# Patient Record
Sex: Male | Born: 1995 | Race: White | Hispanic: No | Marital: Single | Smoking: Current every day smoker
Health system: Southern US, Community
[De-identification: ages and names within clinical notes are randomized; demographics above are authoritative.]

## PROBLEM LIST (undated history)

## (undated) DIAGNOSIS — J45909 Unspecified asthma, uncomplicated: Secondary | ICD-10-CM

## (undated) DIAGNOSIS — G8929 Other chronic pain: Secondary | ICD-10-CM

---
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
## 2016-06-17 ENCOUNTER — Encounter: Payer: Self-pay | Admitting: Emergency Medicine

## 2016-06-17 ENCOUNTER — Emergency Department
Admission: EM | Admit: 2016-06-17 | Discharge: 2016-06-17 | Disposition: A | Discharge status: Home / Self Care | Payer: Self-pay | Attending: Emergency Medicine | Admitting: Emergency Medicine

## 2016-06-17 DIAGNOSIS — J45909 Unspecified asthma, uncomplicated: Secondary | ICD-10-CM | POA: Insufficient documentation

## 2016-06-17 DIAGNOSIS — M436 Torticollis: Secondary | ICD-10-CM | POA: Insufficient documentation

## 2016-06-17 MED ORDER — KETOROLAC TROMETHAMINE 10 MG PO TABS
10.0000 mg | ORAL_TABLET | Freq: Once | ORAL | Status: AC
  Administered 2016-06-17: 10 mg via ORAL
  Filled 2016-06-17: qty 1

## 2016-06-17 MED ORDER — CYCLOBENZAPRINE HCL 10 MG PO TABS: 10.0000 mg | ORAL_TABLET | Freq: Three times a day (TID) | ORAL | 0 refills | Status: DC | PRN

## 2016-06-17 MED ORDER — CYCLOBENZAPRINE HCL 10 MG PO TABS
10.0000 mg | ORAL_TABLET | Freq: Once | ORAL | Status: AC
Start: 2016-06-17 — End: 2016-06-17
  Administered 2016-06-17: 10 mg via ORAL
  Filled 2016-06-17: qty 1

## 2016-06-17 NOTE — ED Triage Notes (Signed)
Pt ambulatory to triage with steady gait. Pt c/o "crick in my neck since yesterday, its so bad this morning." Pt denies other complaints at this time. Pt alert and oriented x 4.

## 2016-06-17 NOTE — ED Provider Notes (Signed)
[facility] Emergency Department Provider Note   First MD Initiated Contact with Patient 06/17/16 [phone]     (approximate)  I have reviewed the triage vital signs and the nursing notes.   HISTORY  Chief Complaint Neck Pain   HPI Jose Everett is a 21 y.o. male presents with acute onset of right side neck pain on awakening yesterday at 1:00AM. Current pain is sharp 8 out of 10 that is worse with movement. Patient denies any arm weakness numbness gait instability or visual changes.   Past Medical History:  Diagnosis Date  . Asthma     Patient Active Problem List   Diagnosis Date Noted  . Pneumothorax on left 08/22/2015    History reviewed. No pertinent surgical history.  Prior to Admission medications   Medication Sig Start Date End Date Taking? Authorizing Provider  cyclobenzaprine (FLEXERIL) 10 MG tablet Take 1 tablet (10 mg total) by mouth 3 (three) times daily as needed. 06/17/16   [name], MD  ibuprofen (ADVIL,MOTRIN) 800 MG tablet Take 1 tablet (800 mg total) by mouth every 8 (eight) hours as needed for moderate pain. 09/09/15   [name], MD  oxyCODONE-acetaminophen (ROXICET) 5-325 MG tablet Take 1 tablet by mouth every 4 (four) hours as needed for severe pain. 09/09/15   [name], MD  traMADol (ULTRAM) 50 MG tablet Take 1 tablet (50 mg total) by mouth every 6 (six) hours as needed for severe pain (use tylenol preferentially). 08/23/15   [name], MD    Allergies Tramadol  No family history on file.  Social History Social History  Substance Use Topics  . Smoking status: Never Smoker  . Smokeless tobacco: Never Used  . Alcohol use Yes     Comment: occ    Review of Systems Constitutional: No fever/chills Eyes: No visual changes. ENT: No sore throat.Positive for right-sided neck pain Cardiovascular: Denies chest pain. Respiratory: Denies shortness of breath. Gastrointestinal: No abdominal pain.  No  nausea, no vomiting.  No diarrhea.  No constipation. Genitourinary: Negative for dysuria. Musculoskeletal: Negative for neck pain.  Negative for back pain. Integumentary: Negative for rash. Neurological: Negative for headaches, focal weakness or numbness.   ____________________________________________   PHYSICAL EXAM:  VITAL SIGNS: ED Triage Vitals  Enc Vitals Group     BP 06/17/16 0551 130/89     Pulse Rate 06/17/16 0551 78     Resp 06/17/16 0551 18     Temp 06/17/16 0551 98.2 F (36.8 C)     Temp Source 06/17/16 0551 Oral     SpO2 06/17/16 0551 98 %     Weight 06/17/16 0551 81.6 kg (180 lb)     Height 06/17/16 0551 1.829 m (6')     Head Circumference --      Peak Flow --      Pain Score 06/17/16 0550 10     Pain Loc --      Pain Edu? --      Excl. in GC? --     Constitutional: Alert and oriented. Well appearing and in no acute distress. Eyes: Conjunctivae are normal.  Head: Atraumatic. Mouth/Throat: Mucous membranes are moist.  Oropharynx non-erythematous. Neck: No stridor.  Pain in her right side neck palpation of the trapezius muscle  Cardiovascular: Normal rate, regular rhythm. Good peripheral circulation. Grossly normal heart sounds. Respiratory: Normal respiratory effort.  No retractions. Lungs CTAB. Gastrointestinal: Soft and nontender. No distention.  Musculoskeletal: No lower extremity tenderness nor  edema. No gross deformities of extremities. Neurologic:  Normal speech and language. No gross focal neurologic deficits are appreciated.  Skin:  Skin is warm, dry and intact. No rash noted. Psychiatric: Mood and affect are normal. Speech and behavior are normal.   Procedures   ____________________________________________   INITIAL IMPRESSION / ASSESSMENT AND PLAN / ED COURSE  Pertinent labs & imaging results that were available during my care of the patient were reviewed by me and considered in my medical decision making (see chart for details).  Patient  given Flexeril and Toradol emergency department will be prescribed Flexeril for home.      ____________________________________________  FINAL CLINICAL IMPRESSION(S) / ED DIAGNOSES  Final diagnoses:  Torticollis, acute     MEDICATIONS GIVEN DURING THIS VISIT:  Medications  ketorolac (TORADOL) tablet 10 mg (10 mg Oral Given 06/17/16 0633)  cyclobenzaprine (FLEXERIL) tablet 10 mg (10 mg Oral Given 06/17/16 0633)     NEW OUTPATIENT MEDICATIONS STARTED DURING THIS VISIT:  New Prescriptions   CYCLOBENZAPRINE (FLEXERIL) 10 MG TABLET    Take 1 tablet (10 mg total) by mouth 3 (three) times daily as needed.    Modified Medications   No medications on file    Discontinued Medications   No medications on file     Note:  This document was prepared using Dragon voice recognition software and may include unintentional dictation errors.    Darci CurrentBrown, Kearny N, MD 06/17/16 [phone]

## 2017-02-28 ENCOUNTER — Encounter: Payer: Self-pay | Admitting: *Deleted

## 2017-02-28 ENCOUNTER — Emergency Department
Admission: EM | Admit: 2017-02-28 | Discharge: 2017-02-28 | Disposition: A | Discharge status: Home / Self Care | Payer: Self-pay | Attending: Emergency Medicine | Admitting: Emergency Medicine

## 2017-02-28 ENCOUNTER — Other Ambulatory Visit: Payer: Self-pay

## 2017-02-28 DIAGNOSIS — F17200 Nicotine dependence, unspecified, uncomplicated: Secondary | ICD-10-CM | POA: Insufficient documentation

## 2017-02-28 DIAGNOSIS — J45909 Unspecified asthma, uncomplicated: Secondary | ICD-10-CM | POA: Insufficient documentation

## 2017-02-28 DIAGNOSIS — K0889 Other specified disorders of teeth and supporting structures: Secondary | ICD-10-CM | POA: Insufficient documentation

## 2017-02-28 MED ORDER — IBUPROFEN 800 MG PO TABS: 800.0000 mg | ORAL_TABLET | Freq: Three times a day (TID) | ORAL | 0 refills | Status: DC | PRN

## 2017-02-28 MED ORDER — AMOXICILLIN 500 MG PO CAPS: 500.0000 mg | ORAL_CAPSULE | Freq: Three times a day (TID) | ORAL | 0 refills | Status: DC

## 2017-02-28 NOTE — Discharge Instructions (Signed)
Follow-up with your regular doctor.  Use 1 of the following dental clinics.  Please call for an appointment.  Use medication as prescribed.  OPTIONS FOR DENTAL FOLLOW UP CARE  [place] Department of Health and Human Services - Local Safety Net Dental Clinics [email or web address]://www.ncdhhs.gov/dph/oralhealth/services/safetynetclinics.htm   [facility] [phone])  [facility] [place] [phone])  [facility] [place] [facility] [phone]  [facility] [phone])  [facility] [phone]) This clinic caters to the indigent population and is on a lottery system. Location: [employer] [address], [address], [place] [facility] Hours: Wednesdays from 6pm - 9pm, patients seen by a lottery system. For dates, call or go to [email or web address] Services: Cleanings, fillings and simple extractions. Payment Options: DENTAL WORK IS FREE OF CHARGE. Bring proof of income or support. Best way to get seen: Arrive at 5:15 pm - this is a lottery, NOT first come/first serve, so arriving earlier will not increase your chances of being seen.     [facility] [phone] Select option 1 for emergencies   Location: [facility], [facility], [address], [place] [facility] Hours: No walk-ins accepted - call the day before to schedule an appointment. Check in times are 9:30 am and 1:30 pm. Services: Simple extractions, temporary fillings, pulpectomy/pulp debridement, uncomplicated abscess drainage. Payment Options: PAYMENT IS DUE AT THE TIME OF SERVICE.  Fee is usually $100-200, additional surgical procedures (e.g. abscess drainage) may be extra. Cash, checks, Visa/MasterCard accepted.  Can file Medicaid if patient is covered for dental - patient should call case worker to check. No discount for [facility] patients. Best way to get seen: MUST call the day before and  get onto the schedule. Can usually be seen the next 1-2 days. No walk-ins accepted.     [facility] [phone]   Location: [facility], [address], [place] [facility] Hours: M, W, Th, F 8am or 1:30pm, Tues 9a or 1:30 - first come/first served. Services: Simple extractions, temporary fillings, uncomplicated abscess drainage.  You do not need to be an [place] resident. Payment Options: PAYMENT IS DUE AT THE TIME OF SERVICE. Dental insurance, otherwise sliding scale - bring proof of income or support. Depending on income and treatment needed, cost is usually $50-200. Best way to get seen: Arrive early as it is first come/first served.     [facility] [phone]   Location: [address] Clinic Hours: Mon-Thu 8a-5p Services: Most basic dental services including extractions and fillings. Payment Options: PAYMENT IS DUE AT THE TIME OF SERVICE. Sliding scale, up to 50% off - bring proof if income or support. Medicaid with dental option accepted. Best way to get seen: Call to schedule an appointment, can usually be seen within 2 weeks OR they will try to see walk-ins - show up at 8a or 2p (you may have to wait).     [facility] [phone] [place] RESIDENTS ONLY   Location: [facility], [address], [place], [place] [address] Clinic Hours: By appointment only. Monday - Thursday 8am-5pm, Friday 8am-12pm Services: Cleanings, fillings, extractions. Payment Options: PAYMENT IS DUE AT THE TIME OF SERVICE. Cash, Visa or MasterCard. Sliding scale - $30 minimum per service. Best way to get seen: Come in to office, complete packet and make an appointment - need proof of income or support monies for each household member and proof of [facility] [place] [facility] [place]  residence. Usually takes about a month to get in.     [facility] [phone]   Location: [address], [facility] Hours: Walk-in Urgent Care Dental Services are offered Monday-Friday mornings only. The numbers of emergencies accepted daily is limited to the number of providers available. Maximum 15 - Mondays, Wednesdays & Thursdays Maximum 10 - Tuesdays & Fridays Services: You do not need to be a [facility] resident to be seen for a dental emergency. Emergencies are defined as pain, swelling, abnormal bleeding, or dental trauma. Walkins will receive x-rays if needed. NOTE: Dental cleaning is not an emergency. Payment Options: PAYMENT IS DUE AT THE TIME OF SERVICE. Minimum co-pay is $40.00 for uninsured patients. Minimum co-pay is $3.00 for Medicaid with dental coverage. Dental Insurance is accepted and must be presented at time of visit. Medicare does not cover dental. Forms of payment: Cash, credit card, checks. Best way to get seen: If not previously registered with the clinic, walk-in dental registration begins at 7:15 am and is on a first come/first serve basis. If previously registered with the clinic, call to make an appointment.     [facility] [phone] [place] RESIDENTS ONLY   Location: [address] [place], [place] Clinic Hours: Mon-Thu 10a-2p Services: Extractions only! Payment Options: FREE (donations accepted) - bring proof of income or support Best way to get seen: Call and schedule an appointment OR come at 8am on the 1st Monday of every month (except for holidays) when it is first come/first served.     [facility] [phone]   Location: [address] [place], [place] Clinic Hours: Friday mornings Services, Payment Options, Best way to get seen: Call for info

## 2017-02-28 NOTE — ED Provider Notes (Signed)
[facility] Emergency Department Provider Note  ____________________________________________   First MD Initiated Contact with Patient 02/28/17 1729     (approximate)  I have reviewed the triage vital signs and the nursing notes.   HISTORY  Chief Complaint Dental Pain    HPI [facility] Towanda MalkinJ Willis is a 22 y.o. male complains of tooth pain.  States he has really bad cavities.  Has wisdom teeth coming in.  And he does not have any health insurance or dental insurance.  He denies fever or chills.  Denies chest pain or shortness of breath  Past Medical History:  Diagnosis Date  . Asthma     Patient Active Problem List   Diagnosis Date Noted  . Pneumothorax on left 08/22/2015    History reviewed. No pertinent surgical history.  Prior to Admission medications   Medication Sig Start Date End Date Taking? Authorizing Provider  amoxicillin (AMOXIL) 500 MG capsule Take 1 capsule (500 mg total) by mouth 3 (three) times daily. 02/28/17   Tyyonna Soucy, Roselyn BeringSusan W, PA-C  ibuprofen (ADVIL,MOTRIN) 800 MG tablet Take 1 tablet (800 mg total) by mouth every 8 (eight) hours as needed. 02/28/17   Faythe GheeFisher, Tykira Wachs W, PA-C    Allergies Tramadol  No family history on file.  Social History Social History   Tobacco Use  . Smoking status: Current Every Day Smoker  . Smokeless tobacco: Never Used  Substance Use Topics  . Alcohol use: Yes    Comment: occ  . Drug use: No    Review of Systems  Constitutional: No fever/chills Eyes: No visual changes. ENT: No sore throat.  Positive for dental pain Respiratory: Denies cough Genitourinary: Negative for dysuria. Musculoskeletal: Negative for back pain. Skin: Negative for rash.    ____________________________________________   PHYSICAL EXAM:  VITAL SIGNS: ED Triage Vitals  Enc Vitals Group     BP 02/28/17 1727 125/76     Pulse Rate 02/28/17 1727 74     Resp 02/28/17 1727 18     Temp 02/28/17 1727 98.1 F (36.7  C)     Temp src --      SpO2 02/28/17 1727 99 %     Weight 02/28/17 1742 195 lb (88.5 kg)     Height 02/28/17 1742 5\' 11"  (1.803 m)     Head Circumference --      Peak Flow --      Pain Score 02/28/17 1742 6     Pain Loc --      Pain Edu? --      Excl. in GC? --     Constitutional: Alert and oriented. Well appearing and in no acute distress. Eyes: Conjunctivae are normal.  Head: Atraumatic. Nose: No congestion/rhinnorhea. Mouth/Throat: Mucous membranes are moist.  Positive for diffuse dental caries and gums are red irritation on the right lower molar.  There is no swelling noted Cardiovascular: Normal rate, regular rhythm. Respiratory: Normal respiratory effort.  No retractions GU: deferred Musculoskeletal: FROM all extremities, warm and well perfused Neurologic:  Normal speech and language.  Skin:  Skin is warm, dry and intact. No rash noted. Psychiatric: Mood and affect are normal. Speech and behavior are normal.  ____________________________________________   LABS (all labs ordered are listed, but only abnormal results are displayed)  Labs Reviewed - No data to display ____________________________________________   ____________________________________________  RADIOLOGY    ____________________________________________   PROCEDURES  Procedure(s) performed: No  Procedures    ____________________________________________   INITIAL IMPRESSION / ASSESSMENT AND PLAN /  ED COURSE  Pertinent labs & imaging results that were available during my care of the patient were reviewed by me and considered in my medical decision making (see chart for details).  Patient is a 22 year old male complaining of dental pain.  He is not have health insurance or dental insurance.  He has been using over-the-counter medications.  On physical exam the patient has several caries noted.  The gums are inflamed.  There is a lot of plaque noted on the teeth.  Patient was instructed to  follow-up with a dentist.  Was given a prescription for amoxicillin and ibuprofen.  Instructed him to call 1 of these dentist as we do not provide dental care in the emergency department.  He is to wash his mouth with warm salt water.  He is to brush his teeth daily.  If he is worsening he can return to the emergency department.  Patient states he understands will comply with instructions.  He was discharged in stable condition     As part of my medical decision making, I reviewed the following data within the electronic MEDICAL RECORD NUMBER Nursing notes reviewed and incorporated, Old chart reviewed, Notes from prior ED visits and [place] Controlled Substance Database  ____________________________________________   FINAL CLINICAL IMPRESSION(S) / ED DIAGNOSES  Final diagnoses:  Tooth ache      NEW MEDICATIONS STARTED DURING THIS VISIT:  New Prescriptions   AMOXICILLIN (AMOXIL) 500 MG CAPSULE    Take 1 capsule (500 mg total) by mouth 3 (three) times daily.   IBUPROFEN (ADVIL,MOTRIN) 800 MG TABLET    Take 1 tablet (800 mg total) by mouth every 8 (eight) hours as needed.     Note:  This document was prepared using Dragon voice recognition software and may include unintentional dictation errors.    Faythe Ghee, PA-C 02/28/17 Elwyn Reach, MD 02/28/17 [phone]

## 2017-02-28 NOTE — ED Triage Notes (Signed)
Patient c/o right-side dental pain on top and bottom jaw that has been present for two months, but is now constant.

## 2017-08-02 ENCOUNTER — Other Ambulatory Visit: Payer: Self-pay

## 2017-08-02 ENCOUNTER — Emergency Department
Admission: EM | Admit: 2017-08-02 | Discharge: 2017-08-02 | Disposition: A | Discharge status: Home / Self Care | Payer: Self-pay | Attending: Emergency Medicine | Admitting: Emergency Medicine

## 2017-08-02 ENCOUNTER — Encounter: Payer: Self-pay | Admitting: Emergency Medicine

## 2017-08-02 DIAGNOSIS — J45909 Unspecified asthma, uncomplicated: Secondary | ICD-10-CM | POA: Insufficient documentation

## 2017-08-02 DIAGNOSIS — F17200 Nicotine dependence, unspecified, uncomplicated: Secondary | ICD-10-CM | POA: Insufficient documentation

## 2017-08-02 DIAGNOSIS — K0889 Other specified disorders of teeth and supporting structures: Secondary | ICD-10-CM | POA: Insufficient documentation

## 2017-08-02 MED ORDER — IBUPROFEN 800 MG PO TABS
800.0000 mg | ORAL_TABLET | Freq: Once | ORAL | Status: AC
  Administered 2017-08-02: 800 mg via ORAL
  Filled 2017-08-02: qty 1

## 2017-08-02 MED ORDER — AMOXICILLIN 500 MG PO TABS: 500.0000 mg | ORAL_TABLET | Freq: Three times a day (TID) | ORAL | 0 refills | Status: AC

## 2017-08-02 NOTE — Discharge Instructions (Signed)
OPTIONS FOR DENTAL FOLLOW UP CARE ° °[place] Department of Health and Human Services - Local Safety Net Dental Clinics °http://www.ncdhhs.gov/dph/oralhealth/services/safetynetclinics.htm °  °[facility] (336-562-3123) ° °[facility] (919-933-9087) ° °[facility] (919-663-1744 ext [phone]) ° °[facility] ([phone]) ° °[facility] ([phone]) °This clinic caters to the indigent population and is on a lottery system. °Location: °[facility], [address], [place] °Clinic Hours: °Wednesdays from 6pm - 9pm, patients seen by a lottery system. °For dates, call or go to www.med.[facility].edu/[facility]/patients/Dental-[facility] °Services: °Cleanings, fillings and simple extractions. °Payment Options: °DENTAL WORK IS FREE OF CHARGE. Bring proof of income or support. °Best way to get seen: °Arrive at 5:15 pm - this is a lottery, NOT first come/first serve, so arriving earlier will not increase your chances of being seen. °  °  °[facility] °919-537-3737 °Select option 1 for emergencies °  °Location: °[facility], [address], [place] °Clinic Hours: °No walk-ins accepted - call the day before to schedule an appointment. °Check in times are 9:30 am and 1:30 pm. °Services: °Simple extractions, temporary fillings, pulpectomy/pulp debridement, uncomplicated abscess drainage. °Payment Options: °PAYMENT IS DUE AT THE TIME OF SERVICE.  Fee is usually $100-200, additional surgical procedures (e.g. abscess drainage) may be extra. °Cash, checks, Visa/MasterCard accepted.  Can file Medicaid if patient is covered for dental - patient should call case worker to check. °No discount for [facility] patients. °Best way to get seen: °MUST call the day before and get onto the schedule. Can usually be seen the next 1-2 days. No walk-ins accepted. °  °  °[facility] °[phone] °   °Location: °[facility], [address], [place] °Clinic Hours: °M, W, Th, F 8am or 1:30pm, Tues 9a or 1:30 - first come/first served. °Services: °Simple extractions, temporary fillings, uncomplicated abscess drainage.  You do not need to be an [place] resident. °Payment Options: °PAYMENT IS DUE AT THE TIME OF SERVICE. °Dental insurance, otherwise sliding scale - bring proof of income or support. °Depending on income and treatment needed, cost is usually $50-200. °Best way to get seen: °Arrive early as it is first come/first served. °  °  °[facility] °[phone] °  °Location: °[address] °Clinic Hours: °Mon-Thu 8a-5p °Services: °Most basic dental services including extractions and fillings. °Payment Options: °PAYMENT IS DUE AT THE TIME OF SERVICE. °Sliding scale, up to 50% off - bring proof if income or support. °Medicaid with dental option accepted. °Best way to get seen: °Call to schedule an appointment, can usually be seen within 2 weeks OR they will try to see walk-ins - show up at 8a or 2p (you may have to wait). °  °  °[facility] °919-245-2435 °[place] RESIDENTS ONLY °  °Location: °[facility], [address], [place], [place] [address] °Clinic Hours: By appointment only. °Monday - Thursday 8am-5pm, Friday 8am-12pm °Services: Cleanings, fillings, extractions. °Payment Options: °PAYMENT IS DUE AT THE TIME OF SERVICE. °Cash, Visa or MasterCard. Sliding scale - $30 minimum per service. °Best way to get seen: °Come in to office, complete packet and make an appointment - need proof of income °or support monies for each household member and proof of [place] residence. °Usually takes about a month to get in. °  °  °[facility] °[phone] °  °Location: °[address]   [place] °Clinic Hours: Walk-in Urgent Care Dental Services are offered Monday-Friday  mornings only. °The numbers of emergencies accepted daily is limited to the number of °providers available. °Maximum 15 - Mondays, Wednesdays & Thursdays °Maximum 10 - Tuesdays & Fridays °Services: °You do not need to be a  [place] resident to be seen for a dental emergency. °Emergencies are defined as pain, swelling, abnormal bleeding, or dental trauma. Walkins will receive x-rays if needed. °NOTE: Dental cleaning is not an emergency. °Payment Options: °PAYMENT IS DUE AT THE TIME OF SERVICE. °Minimum co-pay is $40.00 for uninsured patients. °Minimum co-pay is $3.00 for Medicaid with dental coverage. °Dental Insurance is accepted and must be presented at time of visit. °Medicare does not cover dental. °Forms of payment: Cash, credit card, checks. °Best way to get seen: °If not previously registered with the clinic, walk-in dental registration begins at 7:15 am and is on a first come/first serve basis. °If previously registered with the clinic, call to make an appointment. °  °  °[facility] °[phone] °[place] RESIDENTS ONLY °  °Location: °[address], [place], [place] °Clinic Hours: °Mon-Thu 10a-2p °Services: Extractions only! °Payment Options: °FREE (donations accepted) - bring proof of income or support °Best way to get seen: °Call and schedule an appointment OR come at 8am on the 1st Monday of every month (except for holidays) when it is first come/first served. °  °  °[facility] °[phone] °  °Location: °[address], [place] °Clinic Hours: °Friday mornings °Services, Payment Options, Best way to get seen: °Call for info °

## 2017-08-02 NOTE — ED Triage Notes (Signed)
Patient ambulatory to triage with steady gait, without difficulty or distress noted; pt reports ?wisdom tooth pain; st x 3 days having right upper dental/gum pain; taking ibuprofen with some relief

## 2017-08-02 NOTE — ED Provider Notes (Signed)
[facility] Emergency Department Provider Note  ____________________________________________  Time seen: Approximately 10:33 PM  I have reviewed the triage vital signs and the nursing notes.   HISTORY  Chief Complaint Dental Pain    HPI Jose Everett is a 22 y.o. male presents to the emergency department with dental pain surrounding a wisdom tooth of the right lower jaw.  Patient reports that he has had pain pressure for the past 3 days.  Patient reports that there has been  increased pain and redness over wisdom tooth site and patient is having trismus.  He is trying to get in with [facility].  He has limited access to dental resources within the community due to a lack of dental insurance.  No fever or chills.   Past Medical History:  Diagnosis Date  . Asthma     Patient Active Problem List   Diagnosis Date Noted  . Pneumothorax on left 08/22/2015    History reviewed. No pertinent surgical history.  Prior to Admission medications   Medication Sig Start Date End Date Taking? Authorizing Provider  amoxicillin (AMOXIL) 500 MG tablet Take 1 tablet (500 mg total) by mouth 3 (three) times daily for 10 days. 08/02/17 08/12/17  Orvil Feil, PA-C  ibuprofen (ADVIL,MOTRIN) 800 MG tablet Take 1 tablet (800 mg total) by mouth every 8 (eight) hours as needed. 02/28/17   Faythe Ghee, PA-C    Allergies Tramadol  No family history on file.  Social History Social History   Tobacco Use  . Smoking status: Current Every Day Smoker  . Smokeless tobacco: Never Used  Substance Use Topics  . Alcohol use: Yes    Comment: occ  . Drug use: No     Review of Systems  Constitutional: No fever/chills Eyes: No visual changes. No discharge ENT: Patient has dental pain Cardiovascular: no chest pain. Respiratory: no cough. No SOB. Gastrointestinal: No abdominal pain.  No nausea, no vomiting.  No diarrhea.  No  constipation. Musculoskeletal: Negative for musculoskeletal pain. Skin: Negative for rash, abrasions, lacerations, ecchymosis. Neurological: Negative for headaches, focal weakness or numbness.   ____________________________________________   PHYSICAL EXAM:  VITAL SIGNS: ED Triage Vitals  Enc Vitals Group     BP 08/02/17 2012 116/64     Pulse Rate 08/02/17 2012 63     Resp 08/02/17 2012 18     Temp 08/02/17 2012 98.5 F (36.9 C)     Temp Source 08/02/17 2012 Oral     SpO2 08/02/17 2012 98 %     Weight 08/02/17 2013 190 lb (86.2 kg)     Height 08/02/17 2013 5\' 11"  (1.803 m)     Head Circumference --      Peak Flow --      Pain Score 08/02/17 2013 3     Pain Loc --      Pain Edu? --      Excl. in GC? --      Constitutional: Alert and oriented. Well appearing and in no acute distress. Eyes: Conjunctivae are normal. PERRL. EOMI. Head: Atraumatic. ENT:      Ears:       Nose: No congestion/rhinnorhea.      Mouth/Throat: Mucous membranes are moist.  Patient has healthy dentition with very few dental caries.  Patient has erythema surrounding inferior 32.  No pain was elicited with TMJ testing. Hematological/Lymphatic/Immunilogical: No cervical lymphadenopathy. Cardiovascular: Normal rate, regular rhythm. Normal S1 and S2.  Good peripheral circulation. Respiratory:  Normal respiratory effort without tachypnea or retractions. Lungs CTAB. Good air entry to the bases with no decreased or absent breath sounds.  Skin:  Skin is warm, dry and intact. No rash noted. Psychiatric: Mood and affect are normal. Speech and behavior are normal. Patient exhibits appropriate insight and judgement.   ____________________________________________   LABS (all labs ordered are listed, but only abnormal results are displayed)  Labs Reviewed - No data to display ____________________________________________  EKG   ____________________________________________  RADIOLOGY   No results  found.  ____________________________________________    PROCEDURES  Procedure(s) performed:    Procedures    Medications  ibuprofen (ADVIL,MOTRIN) tablet 800 mg (800 mg Oral Given 08/02/17 2203)     ____________________________________________   INITIAL IMPRESSION / ASSESSMENT AND PLAN / ED COURSE  Pertinent labs & imaging results that were available during my care of the patient were reviewed by me and considered in my medical decision making (see chart for details).  Review of the [place] CSRS was performed in accordance of the [facility] prior to dispensing any controlled drugs.  Assessment and plan Dental pain Patient presents to the emergency department with pain surrounding inferior 32 and erythema with associated trismus.  Patient declined an injection of Toradol due to a fear of needles in the emergency department.  Patient was given ibuprofen 800 and was discharged with amoxicillin.  He was advised to make an appointment with a local dentist as soon as possible.  Dental resources were provided in patient's discharge paperwork.  All patient questions were answered.   ____________________________________________  FINAL CLINICAL IMPRESSION(S) / ED DIAGNOSES  Final diagnoses:  Pain, dental      NEW MEDICATIONS STARTED DURING THIS VISIT:  ED Discharge Orders        Ordered    amoxicillin (AMOXIL) 500 MG tablet  3 times daily     08/02/17 2149          This chart was dictated using voice recognition software/Dragon. Despite best efforts to proofread, errors can occur which can change the meaning. Any change was purely unintentional.    Gasper LloydWoods, Ferry Matthis M, PA-C 08/02/17 2239    Myrna BlazerSchaevitz, David Matthew, MD 08/02/17 [phone]

## 2017-08-02 NOTE — ED Notes (Signed)
Pt ambulatory to POV without difficulty. VSS. NAD. Discharge instructions, RX and follow up given. All questions addressed.  

## 2017-09-17 ENCOUNTER — Encounter: Payer: Self-pay | Admitting: Emergency Medicine

## 2017-09-17 DIAGNOSIS — F17210 Nicotine dependence, cigarettes, uncomplicated: Secondary | ICD-10-CM | POA: Insufficient documentation

## 2017-09-17 DIAGNOSIS — J45909 Unspecified asthma, uncomplicated: Secondary | ICD-10-CM | POA: Insufficient documentation

## 2017-09-17 DIAGNOSIS — H8111 Benign paroxysmal vertigo, right ear: Secondary | ICD-10-CM | POA: Insufficient documentation

## 2017-09-17 NOTE — ED Triage Notes (Signed)
Patient reports that he became dizzy about 21:00 tonight. Patient states that the dizziness is worse when laying down.

## 2017-09-18 ENCOUNTER — Emergency Department
Admission: EM | Admit: 2017-09-18 | Discharge: 2017-09-18 | Disposition: A | Discharge status: Home / Self Care | Payer: Medicaid Other | Attending: Emergency Medicine | Admitting: Emergency Medicine

## 2017-09-18 DIAGNOSIS — H8111 Benign paroxysmal vertigo, right ear: Secondary | ICD-10-CM

## 2017-09-18 LAB — CBC
HCT: 45.4 % (ref 40.0–52.0)
HEMOGLOBIN: 16.1 g/dL (ref 13.0–18.0)
MCH: 31.3 pg (ref 26.0–34.0)
MCHC: 35.5 g/dL (ref 32.0–36.0)
MCV: 88.1 fL (ref 80.0–100.0)
Platelets: 211 10*3/uL (ref 150–440)
RBC: 5.15 MIL/uL (ref 4.40–5.90)
RDW: 12.9 % (ref 11.5–14.5)
WBC: 10 10*3/uL (ref 3.8–10.6)

## 2017-09-18 LAB — URINALYSIS, COMPLETE (UACMP) WITH MICROSCOPIC
BACTERIA UA: NONE SEEN
Bilirubin Urine: NEGATIVE
Glucose, UA: NEGATIVE mg/dL
Hgb urine dipstick: NEGATIVE
Ketones, ur: NEGATIVE mg/dL
Leukocytes, UA: NEGATIVE
Nitrite: NEGATIVE
PROTEIN: NEGATIVE mg/dL
Specific Gravity, Urine: 1.019 (ref 1.005–1.030)
Squamous Epithelial / LPF: NONE SEEN (ref 0–5)
pH: 6 (ref 5.0–8.0)

## 2017-09-18 LAB — BASIC METABOLIC PANEL
ANION GAP: 8 (ref 5–15)
BUN: 10 mg/dL (ref 6–20)
CALCIUM: 9.6 mg/dL (ref 8.9–10.3)
CO2: 27 mmol/L (ref 22–32)
Chloride: 104 mmol/L (ref 98–111)
Creatinine, Ser: 1.16 mg/dL (ref 0.61–1.24)
GFR calc Af Amer: >60 mL/min (ref >60)
GFR calc non Af Amer: >60 mL/min (ref >60)
Glucose, Bld: 79 mg/dL (ref 70–99)
Potassium: 3.6 mmol/L (ref 3.5–5.1)
Sodium: 139 mmol/L (ref 135–145)

## 2017-09-18 MED ORDER — MECLIZINE HCL 25 MG PO TABS: 25.0000 mg | ORAL_TABLET | Freq: Three times a day (TID) | ORAL | 0 refills | Status: AC | PRN

## 2017-09-18 NOTE — ED Provider Notes (Signed)
[facility] Emergency Department Provider Note  ____________________________________________   First MD Initiated Contact with Patient 09/18/17 0123     (approximate)  I have reviewed the triage vital signs and the nursing notes.   HISTORY  Chief Complaint Dizziness    HPI Jose Everett is a 22 y.o. male who self presents to the emergency department with sudden onset severe room spinning "dizziness" that began this evening while lying in bed.  It happened when he was lying on his left side and worsened when he rolled over onto his right.  Symptoms lasted less than a minute of time.  He was nauseated but did not vomit.  They occurred multiple times when moving his head.  Have not recurred in the last several hours.  He does have some right ear discomfort.  Pain is mild.  No fevers or chills.  No history of the same.  No numbness or weakness.  No tinnitus.    Past Medical History:  Diagnosis Date  . Asthma     Patient Active Problem List   Diagnosis Date Noted  . Pneumothorax on left 08/22/2015    History reviewed. No pertinent surgical history.  Prior to Admission medications   Medication Sig Start Date End Date Taking? Authorizing Provider  ibuprofen (ADVIL,MOTRIN) 800 MG tablet Take 1 tablet (800 mg total) by mouth every 8 (eight) hours as needed. 02/28/17   Fisher, Roselyn Bering, PA-C  meclizine (ANTIVERT) 25 MG tablet Take 1 tablet (25 mg total) by mouth 3 (three) times daily as needed for dizziness. 09/18/17   Merrily Brittle, MD    Allergies Tramadol  No family history on file.  Social History Social History   Tobacco Use  . Smoking status: Current Every Day Smoker  . Smokeless tobacco: Never Used  Substance Use Topics  . Alcohol use: Yes    Comment: occ  . Drug use: No    Review of Systems Constitutional: No fever/chills Eyes: No visual changes. ENT: Positive for right ear pain Cardiovascular: Denies chest  pain. Respiratory: Denies shortness of breath. Gastrointestinal: No abdominal pain.  Positive for nausea, no vomiting.  No diarrhea.  No constipation. Genitourinary: Negative for dysuria. Musculoskeletal: Negative for back pain. Skin: Negative for rash. Neurological: Negative for headaches, focal weakness or numbness.   ____________________________________________   PHYSICAL EXAM:  VITAL SIGNS: ED Triage Vitals  Enc Vitals Group     BP 09/17/17 2356 116/66     Pulse Rate 09/17/17 2356 85     Resp 09/17/17 2356 18     Temp 09/17/17 2356 98.1 F (36.7 C)     Temp Source 09/17/17 2356 Oral     SpO2 09/17/17 2356 100 %     Weight 09/17/17 2358 190 lb (86.2 kg)     Height 09/17/17 2358 5\' 11"  (1.803 m)     Head Circumference --      Peak Flow --      Pain Score 09/17/17 2357 4     Pain Loc --      Pain Edu? --      Excl. in GC? --     Constitutional: Alert and oriented x4 pleasant cooperative speaks in full clear sentences no diaphoresis Eyes: PERRL EOMI. no nystagmus  head: Atraumatic.  Dermal tympanic membranes bilaterally Nose: No congestion/rhinnorhea. Mouth/Throat: No trismus Neck: No stridor.   Cardiovascular: Normal rate, regular rhythm. Grossly normal heart sounds.  Good peripheral circulation. Respiratory: Normal respiratory effort.  No retractions. Lungs  CTAB and moving good air Gastrointestinal: Soft nontender Musculoskeletal: No lower extremity edema   Neurologic:  Normal speech and language. No gross focal neurologic deficits are appreciated. Normal finger-nose-finger no dysdiadochokinesis no truncal ataxia ambulates with strong steady gait Skin:  Skin is warm, dry and intact. No rash noted. Psychiatric: Mood and affect are normal. Speech and behavior are normal.    ____________________________________________   DIFFERENTIAL includes but not limited to  BPPV, Mnire's disease, otitis media, cerebellar  stroke ____________________________________________   LABS (all labs ordered are listed, but only abnormal results are displayed)  Labs Reviewed  URINALYSIS, COMPLETE (UACMP) WITH MICROSCOPIC - Abnormal; Notable for the following components:      Result Value   Color, Urine YELLOW (*)    APPearance CLEAR (*)    All other components within normal limits  BASIC METABOLIC PANEL  CBC   Lab work reviewed by me with no acute disease  __________________________________________  EKG  ED ECG REPORT I, Merrily Brittle, the attending physician, personally viewed and interpreted this ECG.  Date: 09/18/2017 EKG Time:  Rate: 69 Rhythm: normal sinus rhythm QRS Axis: normal Intervals: normal ST/T Wave abnormalities: normal Narrative Interpretation: no evidence of acute ischemia  ____________________________________________  RADIOLOGY   ____________________________________________   PROCEDURES  Procedure(s) performed: no  Procedures  Critical Care performed: no  ____________________________________________   INITIAL IMPRESSION / ASSESSMENT AND PLAN / ED COURSE  Pertinent labs & imaging results that were available during my care of the patient were reviewed by me and considered in my medical decision making (see chart for details).   As part of my medical decision making, I reviewed the following data within the electronic MEDICAL RECORD NUMBER History obtained from family if available, nursing notes, old chart and ekg, as well as notes from prior ED visits.  Patient arrives well-appearing with an unremarkable exam.  His history is most consistent with BPPV that is resolved.  We discussed the Epley maneuver and I will prescribe him Antivert for home should his symptoms recur.  No cerebellar signs.  Discharged home in stable condition.      ____________________________________________   FINAL CLINICAL IMPRESSION(S) / ED DIAGNOSES  Final diagnoses:  Benign paroxysmal  positional vertigo of right ear      NEW MEDICATIONS STARTED DURING THIS VISIT:  Discharge Medication List as of 09/18/2017  1:35 AM    START taking these medications   Details  meclizine (ANTIVERT) 25 MG tablet Take 1 tablet (25 mg total) by mouth 3 (three) times daily as needed for dizziness., Starting Mon 09/18/2017, Print         Note:  This document was prepared using Dragon voice recognition software and may include unintentional dictation errors.     Merrily Brittle, MD 09/18/17 [phone]

## 2017-09-18 NOTE — Discharge Instructions (Signed)
Please use your vertigo medication as needed for severe symptoms and follow-up with your primary care physician as needed.  If your symptoms recur perform the Epley maneuver up to 10 times to help.  Return to the emergency department sooner for any concerns.  It was a pleasure to take care of you today, and thank you for coming to our emergency department.  If you have any questions or concerns before leaving please ask the nurse to grab me and I'm more than happy to go through your aftercare instructions again.  If you were prescribed any opioid pain medication today such as Norco, Vicodin, Percocet, morphine, hydrocodone, or oxycodone please make sure you do not drive when you are taking this medication as it can alter your ability to drive safely.  If you have any concerns once you are home that you are not improving or are in fact getting worse before you can make it to your follow-up appointment, please do not hesitate to call 911 and come back for further evaluation.  Merrily Brittle, MD  Results for orders placed or performed during the hospital encounter of 09/18/17  Basic metabolic panel  Result Value Ref Range   Sodium 139 135 - 145 mmol/L   Potassium 3.6 3.5 - 5.1 mmol/L   Chloride 104 98 - 111 mmol/L   CO2 27 22 - 32 mmol/L   Glucose, Bld 79 70 - 99 mg/dL   BUN 10 6 - 20 mg/dL   Creatinine, Ser 2.63 0.61 - 1.24 mg/dL   Calcium 9.6 8.9 - 33.5 mg/dL   GFR calc non Af Amer >60 >60 mL/min   GFR calc Af Amer >60 >60 mL/min   Anion gap 8 5 - 15  CBC  Result Value Ref Range   WBC 10.0 3.8 - 10.6 K/uL   RBC 5.15 4.40 - 5.90 MIL/uL   Hemoglobin 16.1 13.0 - 18.0 g/dL   HCT 45.6 25.6 - 38.9 %   MCV 88.1 80.0 - 100.0 fL   MCH 31.3 26.0 - 34.0 pg   MCHC 35.5 32.0 - 36.0 g/dL   RDW 37.3 42.8 - 76.8 %   Platelets 211 150 - 440 K/uL  Urinalysis, Complete w Microscopic  Result Value Ref Range   Color, Urine YELLOW (A) YELLOW   APPearance CLEAR (A) CLEAR   Specific Gravity, Urine  1.019 1.005 - 1.030   pH 6.0 5.0 - 8.0   Glucose, UA NEGATIVE NEGATIVE mg/dL   Hgb urine dipstick NEGATIVE NEGATIVE   Bilirubin Urine NEGATIVE NEGATIVE   Ketones, ur NEGATIVE NEGATIVE mg/dL   Protein, ur NEGATIVE NEGATIVE mg/dL   Nitrite NEGATIVE NEGATIVE   Leukocytes, UA NEGATIVE NEGATIVE   RBC / HPF 0-5 0 - 5 RBC/hpf   WBC, UA 0-5 0 - 5 WBC/hpf   Bacteria, UA NONE SEEN NONE SEEN   Squamous Epithelial / LPF NONE SEEN 0 - 5   Mucus PRESENT

## 2017-10-18 ENCOUNTER — Other Ambulatory Visit: Payer: Self-pay

## 2017-10-18 ENCOUNTER — Emergency Department: Payer: Medicaid Other

## 2017-10-18 ENCOUNTER — Emergency Department
Admission: EM | Admit: 2017-10-18 | Discharge: 2017-10-18 | Disposition: A | Discharge status: Home / Self Care | Payer: Medicaid Other | Attending: Emergency Medicine | Admitting: Emergency Medicine

## 2017-10-18 DIAGNOSIS — J9801 Acute bronchospasm: Secondary | ICD-10-CM | POA: Insufficient documentation

## 2017-10-18 DIAGNOSIS — J45909 Unspecified asthma, uncomplicated: Secondary | ICD-10-CM | POA: Insufficient documentation

## 2017-10-18 DIAGNOSIS — Z79899 Other long term (current) drug therapy: Secondary | ICD-10-CM | POA: Insufficient documentation

## 2017-10-18 DIAGNOSIS — J069 Acute upper respiratory infection, unspecified: Secondary | ICD-10-CM | POA: Insufficient documentation

## 2017-10-18 DIAGNOSIS — F17210 Nicotine dependence, cigarettes, uncomplicated: Secondary | ICD-10-CM | POA: Insufficient documentation

## 2017-10-18 MED ORDER — BENZONATATE 100 MG PO CAPS: 200.0000 mg | ORAL_CAPSULE | Freq: Three times a day (TID) | ORAL | 0 refills | Status: DC | PRN

## 2017-10-18 MED ORDER — BENZONATATE 100 MG PO CAPS
200.0000 mg | ORAL_CAPSULE | Freq: Once | ORAL | Status: DC
  Filled 2017-10-18: qty 2

## 2017-10-18 MED ORDER — PSEUDOEPH-BROMPHEN-DM 30-2-10 MG/5ML PO SYRP: 5.0000 mL | ORAL_SOLUTION | Freq: Four times a day (QID) | ORAL | 0 refills | Status: AC | PRN

## 2017-10-18 MED ORDER — DEXAMETHASONE SODIUM PHOSPHATE 10 MG/ML IJ SOLN
10.0000 mg | Freq: Once | INTRAMUSCULAR | Status: AC
  Administered 2017-10-18: 10 mg via INTRAMUSCULAR
  Filled 2017-10-18: qty 1

## 2017-10-18 MED ORDER — METHYLPREDNISOLONE 4 MG PO TBPK: ORAL_TABLET | ORAL | 0 refills | Status: AC
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
## 2017-10-18 NOTE — ED Triage Notes (Addendum)
C/o body aches, congestion, nasal congestion, cough X 1 week. Pt here because he wants to go to work today and does not want to call out again. Pt states that he needs a doctor's note if not able to go to work today.  Pt given mask at time of triage. Pt concerned that he has "bronchitis from smoking too much" after doing research on Internet about his symptoms.

## 2017-10-18 NOTE — ED Notes (Signed)
See triage note states he developed nasal congestion and cough couple of days ago  Unsure of fever  But afebrile on arrival  resp even and non labored
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
## 2018-06-14 ENCOUNTER — Encounter: Payer: Self-pay | Admitting: Emergency Medicine

## 2018-06-14 ENCOUNTER — Emergency Department
Admission: EM | Admit: 2018-06-14 | Discharge: 2018-06-14 | Disposition: A | Discharge status: Home / Self Care | Payer: Medicaid Other | Attending: Emergency Medicine | Admitting: Emergency Medicine

## 2018-06-14 ENCOUNTER — Other Ambulatory Visit: Payer: Self-pay

## 2018-06-14 DIAGNOSIS — K0889 Other specified disorders of teeth and supporting structures: Secondary | ICD-10-CM | POA: Insufficient documentation

## 2018-06-14 DIAGNOSIS — F17200 Nicotine dependence, unspecified, uncomplicated: Secondary | ICD-10-CM | POA: Insufficient documentation

## 2018-06-14 DIAGNOSIS — J45909 Unspecified asthma, uncomplicated: Secondary | ICD-10-CM | POA: Insufficient documentation

## 2018-06-14 HISTORY — DX: Other chronic pain: G89.29

## 2018-06-14 MED ORDER — LIDOCAINE VISCOUS HCL 2 % MT SOLN
15.0000 mL | Freq: Once | OROMUCOSAL | Status: AC
  Administered 2018-06-14: 02:00:00 15 mL via OROMUCOSAL

## 2018-06-14 MED ORDER — KETOROLAC TROMETHAMINE 30 MG/ML IJ SOLN
30.0000 mg | Freq: Once | INTRAMUSCULAR | Status: AC
  Administered 2018-06-14: 30 mg via INTRAVENOUS

## 2018-06-14 MED ORDER — KETOROLAC TROMETHAMINE 30 MG/ML IJ SOLN
INTRAMUSCULAR | Status: AC
  Administered 2018-06-14: 30 mg via INTRAVENOUS
  Filled 2018-06-14: qty 1

## 2018-06-14 MED ORDER — IBUPROFEN 800 MG PO TABS: 800.0000 mg | ORAL_TABLET | Freq: Three times a day (TID) | ORAL | 0 refills | Status: AC | PRN

## 2018-06-14 MED ORDER — LIDOCAINE VISCOUS HCL 2 % MT SOLN
OROMUCOSAL | Status: AC
  Administered 2018-06-14: 15 mL via OROMUCOSAL
  Filled 2018-06-14: qty 15

## 2018-06-14 MED ORDER — HYDROCODONE-ACETAMINOPHEN 7.5-325 MG/15ML PO SOLN: 10.0000 mL | Freq: Three times a day (TID) | ORAL | 0 refills | Status: AC | PRN

## 2018-06-14 NOTE — ED Triage Notes (Signed)
Patient ambulatory to triage with steady gait, without difficulty or distress noted, mask in place; pt reports left lower dental pain x 1-66mos

## 2018-06-14 NOTE — Discharge Instructions (Signed)
You have been seen in the Emergency Department (ED) today for dental pain.  Please take your prescribed antibiotic.  You may take pain medication as needed but ONLY as prescribed.  You should also take over-the-counter pain medication such as ibuprofen according to the label instructions unless a doctor has previously told you to avoid this type of medication (due to stomach ulcers, for example).    Do not take the prescribed liquid pain medicine during the day or if you are going to be driving or operating machinery because it may make you sleepy.  Only use it as needed for severe pain that is not helped by ibuprofen or Tylenol (acetaminophen) or if you are having a lot of trouble sleeping.  Please consider this a one-time prescription from this emergency department.  All future strong pain medicines (narcotics) will need to come from your dentist.  Please see you dentist as soon as possible; only a dentist will be able to fix your problem(s).  Please see below for dental follow up options.  Return to the ED if you develop worsening pain, fever, pus/drainage, difficulty breathing, or other symptoms that concern you.

## 2018-06-14 NOTE — ED Provider Notes (Signed)
[facility] Emergency Department Provider Note  ____________________________________________   First MD Initiated Contact with Patient 06/14/18 0211     (approximate)  I have reviewed the triage vital signs and the nursing notes.   HISTORY  Chief Complaint Dental Pain    HPI Jose Everett is a 23 y.o. male who presents for evaluation of severe pain and his left lower rear molar or wisdom tooth.  He has a history of dental pain for more than a year but just recently was able to go to a dentist, about 1 week ago.  That dentist referred him to another dentist and [facility] who is supposed to remove multiple teeth including the one that is causing any trouble tonight.  He has no swelling but pain that is throbbing and worse at night.  No trouble swallowing, denies fever/chills, cough, chest pain, shortness of breath.  He just got dental insurance became active today so he does plan to follow-up with dentist.  He said that ibuprofen and Tylenol do not help.  His dentist did not indicate that he had an infection when he was seen a week ago.  The pain is more or less constant.         Past Medical History:  Diagnosis Date   Asthma     Patient Active Problem List   Diagnosis Date Noted   Pneumothorax on left 08/22/2015    History reviewed. No pertinent surgical history.  Prior to Admission medications   Medication Sig Start Date End Date Taking? Authorizing Provider  brompheniramine-pseudoephedrine-DM 30-2-10 MG/5ML syrup Take 5 mLs by mouth 4 (four) times daily as needed. 10/18/17   Joni ReiningSmith, Ronald K, PA-C  HYDROcodone-acetaminophen (HYCET) 7.5-325 mg/15 ml solution Take 10 mLs by mouth 3 (three) times daily as needed for moderate pain. 06/14/18 06/14/19  Loleta RoseForbach, Fuad Forget, MD  ibuprofen (ADVIL,MOTRIN) 800 MG tablet Take 1 tablet (800 mg total) by mouth every 8 (eight) hours as needed. 02/28/17   Fisher, Roselyn BeringSusan W, PA-C  meclizine (ANTIVERT) 25 MG tablet Take 1  tablet (25 mg total) by mouth 3 (three) times daily as needed for dizziness. 09/18/17   Merrily Brittleifenbark, Neil, MD  methylPREDNISolone (MEDROL DOSEPAK) 4 MG TBPK tablet Take Tapered dose as directed 10/18/17   Joni ReiningSmith, Ronald K, PA-C    Allergies Tramadol  No family history on file.  Social History Social History   Tobacco Use   Smoking status: Current Every Day Smoker   Smokeless tobacco: Never Used  Substance Use Topics   Alcohol use: Yes    Comment: occ   Drug use: No    Review of Systems Constitutional: No fever/chills ENT: Dental pain specifically in the left rear molar or wisdom tooth.  No swelling.  No difficulty swallowing. Gastrointestinal: No nausea no vomiting. Musculoskeletal: Negative for neck pain.  Negative for back pain. Integumentary: Negative for rash. Neurological: Negative for headaches, focal weakness or numbness.   ____________________________________________   PHYSICAL EXAM:  VITAL SIGNS: ED Triage Vitals  Enc Vitals Group     BP 06/14/18 0005 119/68     Pulse Rate 06/14/18 0005 70     Resp 06/14/18 0005 18     Temp 06/14/18 0005 98.2 F (36.8 C)     Temp Source 06/14/18 0005 Oral     SpO2 06/14/18 0005 100 %     Weight 06/14/18 0004 90.7 kg (200 lb)     Height 06/14/18 0004 1.803 m (5\' 11" )     Head Circumference --  Peak Flow --      Pain Score 06/14/18 0004 10     Pain Loc --      Pain Edu? --      Excl. in GC? --     Constitutional: Alert and oriented. Well appearing and in no acute distress. Eyes: Conjunctivae are normal.  Head: Atraumatic. Nose: No congestion/rhinnorhea. Mouth/Throat: Mucous membranes are moist.  No obvious dental caries in the affected region.  Tooth #17 is well-appearing but he has a partially erupted wisdom tooth medially behind it.  There is no tenderness to palpation of the gums, no obvious source of infection or inflammation. Neck: No stridor.  No meningeal signs.   Cardiovascular: Good peripheral  circulation. Respiratory: Normal respiratory effort.  No retractions. No audible wheezing. Neurologic:  Normal speech and language. No gross focal neurologic deficits are appreciated.  Skin:  Skin is warm, dry and intact. No rash noted. Psychiatric: Mood and affect are normal. Speech and behavior are normal. ____________________________________________   LABS (all labs ordered are listed, but only abnormal results are displayed)  Labs Reviewed - No data to display ____________________________________________  EKG  No indication for EKG ____________________________________________  RADIOLOGY   ED MD interpretation: No indication for imaging  Official radiology report(s): No results found.  ____________________________________________   PROCEDURES   Procedure(s) performed (including Critical Care):  Procedures   ____________________________________________   INITIAL IMPRESSION / MDM / ASSESSMENT AND PLAN / ED COURSE  As part of my medical decision making, I reviewed the following data within the electronic MEDICAL RECORD NUMBER Nursing notes reviewed and incorporated, Notes from prior ED visits and [place] Controlled Substance Database      *Jose Everett was evaluated in Emergency Department on 06/14/2018 for the symptoms described in the history of present illness. He was evaluated in the context of the global COVID-19 pandemic, which necessitated consideration that the patient might be at risk for infection with the SARS-CoV-2 virus that causes COVID-19. Institutional protocols and algorithms that pertain to the evaluation of patients at risk for COVID-19 are in a state of rapid change based on information released by regulatory bodies including the CDC and federal and state organizations. These policies and algorithms were followed during the patient's care in the ED.  Some ED evaluations and interventions may be delayed as a result of limited staffing during the  pandemic.*  No evidence of acute dental infection.  The patient is having chronic pain likely from a partially erupted left lower molar.  He is already seen a dentist about a week ago who agreed that he did not need antibiotics.  The pain is make it difficult for him to sleep.  I reviewed the [place] controlled substance database and he has no prescriptions for controlled substances.  He said that Tylenol and ibuprofen are not helping.  I gave him some viscous lidocaine to swish and spit but it did not seem to help either.  I gave him a one-time prescription for Lortab elixir (he says that he has never been able to swallow pills well) but explained this is a one-time deal and that he should not expect controlled substances or narcotics from the ED in the future.  I encouraged him to follow-up with his dentist at the next available opportunity.  I ordered Toradol 30 mg intramuscular injection to help with the pain (he drove himself tonight) and encourage close outpatient follow-up.  He understands and agrees with the plan and I gave my usual customary return  precautions.      ____________________________________________  FINAL CLINICAL IMPRESSION(S) / ED DIAGNOSES  Final diagnoses:  Pain, dental     MEDICATIONS GIVEN DURING THIS VISIT:  Medications  ketorolac (TORADOL) 30 MG/ML injection 30 mg (30 mg Intravenous Given 06/14/18 0216)  lidocaine (XYLOCAINE) 2 % viscous mouth solution 15 mL (15 mLs Mouth/Throat Given 06/14/18 0217)     ED Discharge Orders         Ordered    HYDROcodone-acetaminophen (HYCET) 7.5-325 mg/15 ml solution  3 times daily PRN     06/14/18 0221           Note:  This document was prepared using Dragon voice recognition software and may include unintentional dictation errors.   Loleta Rose, MD 06/14/18 [phone]

## 2018-10-16 ENCOUNTER — Other Ambulatory Visit: Payer: Self-pay

## 2018-10-16 DIAGNOSIS — Z20822 Contact with and (suspected) exposure to covid-19: Secondary | ICD-10-CM

## 2018-10-17 LAB — NOVEL CORONAVIRUS, NAA: SARS-CoV-2, NAA: NOT DETECTED

## 2020-06-06 ENCOUNTER — Other Ambulatory Visit: Payer: Self-pay

## 2020-06-06 ENCOUNTER — Emergency Department: Payer: Medicaid Other

## 2020-06-06 DIAGNOSIS — E876 Hypokalemia: Secondary | ICD-10-CM | POA: Insufficient documentation

## 2020-06-06 DIAGNOSIS — D72829 Elevated white blood cell count, unspecified: Secondary | ICD-10-CM | POA: Insufficient documentation

## 2020-06-06 DIAGNOSIS — J45909 Unspecified asthma, uncomplicated: Secondary | ICD-10-CM | POA: Insufficient documentation

## 2020-06-06 DIAGNOSIS — F17200 Nicotine dependence, unspecified, uncomplicated: Secondary | ICD-10-CM | POA: Insufficient documentation

## 2020-06-06 DIAGNOSIS — R002 Palpitations: Secondary | ICD-10-CM | POA: Insufficient documentation

## 2020-06-06 LAB — CBC
HCT: 42.8 % (ref 39.0–52.0)
Hemoglobin: 15.1 g/dL (ref 13.0–17.0)
MCH: 30.6 pg (ref 26.0–34.0)
MCHC: 35.3 g/dL (ref 30.0–36.0)
MCV: 86.8 fL (ref 80.0–100.0)
Platelets: 214 10*3/uL (ref 150–400)
RBC: 4.93 MIL/uL (ref 4.22–5.81)
RDW: 12.5 % (ref 11.5–15.5)
WBC: 12.2 10*3/uL — ABNORMAL HIGH (ref 4.0–10.5)
nRBC: 0 % (ref 0.0–0.2)

## 2020-06-06 LAB — BASIC METABOLIC PANEL
Anion gap: 9 (ref 5–15)
BUN: 7 mg/dL (ref 6–20)
CO2: 26 mmol/L (ref 22–32)
Calcium: 9.5 mg/dL (ref 8.9–10.3)
Chloride: 105 mmol/L (ref 98–111)
Creatinine, Ser: 1.15 mg/dL (ref 0.61–1.24)
GFR, Estimated: >60 mL/min (ref >60)
Glucose, Bld: 78 mg/dL (ref 70–99)
Potassium: 3.2 mmol/L — ABNORMAL LOW (ref 3.5–5.1)
Sodium: 140 mmol/L (ref 135–145)

## 2020-06-06 LAB — TROPONIN I (HIGH SENSITIVITY): Troponin I (High Sensitivity): <2 ng/L (ref <18)

## 2020-06-06 NOTE — ED Triage Notes (Signed)
Pt states all week when he lays down he starts getting palpitations, denies cough/fevers. Reports 'I have had panic attacks before'

## 2020-06-07 ENCOUNTER — Emergency Department
Admission: EM | Admit: 2020-06-07 | Discharge: 2020-06-07 | Disposition: A | Discharge status: Home / Self Care | Payer: Medicaid Other | Attending: Emergency Medicine | Admitting: Emergency Medicine

## 2020-06-07 ENCOUNTER — Other Ambulatory Visit: Payer: Self-pay

## 2020-06-07 DIAGNOSIS — R002 Palpitations: Secondary | ICD-10-CM

## 2020-06-07 LAB — MAGNESIUM: Magnesium: 2.1 mg/dL (ref 1.7–2.4)

## 2020-06-07 LAB — TSH: TSH: 2.096 u[IU]/mL (ref 0.350–4.500)

## 2020-06-07 LAB — T4, FREE: Free T4: 0.95 ng/dL (ref 0.61–1.12)

## 2020-06-07 LAB — TROPONIN I (HIGH SENSITIVITY): Troponin I (High Sensitivity): <2 ng/L (ref <18)

## 2020-06-07 MED ORDER — POTASSIUM CHLORIDE CRYS ER 20 MEQ PO TBCR
40.0000 meq | EXTENDED_RELEASE_TABLET | Freq: Once | ORAL | Status: AC
  Administered 2020-06-07: 40 meq via ORAL
  Filled 2020-06-07: qty 2

## 2020-06-07 NOTE — Discharge Instructions (Signed)
Call Dr. Milta Deiters office for an appointment for Holter monitoring.  In the meantime return to the emergency room if you have chest pain, shortness of breath, or dizziness associated with the symptoms.

## 2020-06-07 NOTE — ED Provider Notes (Signed)
[facility] Emergency Department Provider Note  ____________________________________________  Time seen: Approximately 4:30 AM  I have reviewed the triage vital signs and the nursing notes.   HISTORY  Chief Complaint Palpitations   HPI Jose Everett is a 25 y.o. male for history of asthma and pneumothorax who presents for evaluation of palpitations.  Patient reports that he has been having palpitations on a nightly basis over the last week.  He only has them at nighttime. He reports that he will be almost falling asleep when he feels his heart racing and that wakes him up.  He continues to have palpitations for 10 to 15 minutes and then it resolves spontaneously once he is able to calm himself down and breathe.  No chest pain or shortness of breath associated with it.  No palpitations during the day.  He does report drinking 2 L of Coke on a daily basis, no other caffeine or power drinks.  He smokes cigarettes but denies any drug use.  Reports occasional alcohol use.  No personal or family history of dysrhythmias.  No chest pain, shortness of breath, fever, cough, vomiting or diarrhea.  Past Medical History:  Diagnosis Date  . Asthma   . Chronic dental pain     Patient Active Problem List   Diagnosis Date Noted  . Pneumothorax on left 08/22/2015    No past surgical history on file.  Prior to Admission medications   Medication Sig Start Date End Date Taking? Authorizing Provider  brompheniramine-pseudoephedrine-DM 30-2-10 MG/5ML syrup Take 5 mLs by mouth 4 (four) times daily as needed. 10/18/17   Joni Reining, PA-C  ibuprofen (ADVIL) 800 MG tablet Take 1 tablet (800 mg total) by mouth every 8 (eight) hours as needed. 06/14/18   Loleta Rose, MD  meclizine (ANTIVERT) 25 MG tablet Take 1 tablet (25 mg total) by mouth 3 (three) times daily as needed for dizziness. 09/18/17   Merrily Brittle, MD  methylPREDNISolone (MEDROL DOSEPAK) 4 MG TBPK tablet  Take Tapered dose as directed 10/18/17   Joni Reining, PA-C    Allergies Tramadol  No family history on file.  Social History Social History   Tobacco Use  . Smoking status: Current Every Day Smoker  . Smokeless tobacco: Never Used  Vaping Use  . Vaping Use: Never used  Substance Use Topics  . Alcohol use: Yes    Comment: occ  . Drug use: No    Review of Systems  Constitutional: Negative for fever. Eyes: Negative for visual changes. ENT: Negative for sore throat. Neck: No neck pain  Cardiovascular: Negative for chest pain. + palpitations Respiratory: Negative for shortness of breath. Gastrointestinal: Negative for abdominal pain, vomiting or diarrhea. Genitourinary: Negative for dysuria. Musculoskeletal: Negative for back pain. Skin: Negative for rash. Neurological: Negative for headaches, weakness or numbness. Psych: No SI or HI  ____________________________________________   PHYSICAL EXAM:  VITAL SIGNS: ED Triage Vitals [06/06/20 2146]  Enc Vitals Group     BP 131/79     Pulse Rate 91     Resp 16     Temp 98 F (36.7 C)     Temp Source Oral     SpO2 100 %     Weight 199 lb 15.3 oz (90.7 kg)     Height 5\' 11"  (1.803 m)     Head Circumference      Peak Flow      Pain Score 2     Pain Loc  Pain Edu?      Excl. in GC?     Constitutional: Alert and oriented. Well appearing and in no apparent distress. HEENT:      Head: Normocephalic and atraumatic.         Eyes: Conjunctivae are normal. Sclera is non-icteric.       Mouth/Throat: Mucous membranes are moist.       Neck: Supple with no signs of meningismus. Cardiovascular: Regular rate and rhythm. No murmurs, gallops, or rubs. 2+ symmetrical distal pulses are present in all extremities. No JVD. Respiratory: Normal respiratory effort. Lungs are clear to auscultation bilaterally.  Gastrointestinal: Soft, non tender, and non distended with positive bowel sounds. No rebound or  guarding. Genitourinary: No CVA tenderness. Musculoskeletal:  No edema, cyanosis, or erythema of extremities. Neurologic: Normal speech and language. Face is symmetric. Moving all extremities. No gross focal neurologic deficits are appreciated. Skin: Skin is warm, dry and intact. No rash noted. Psychiatric: Mood and affect are normal. Speech and behavior are normal.  ____________________________________________   LABS (all labs ordered are listed, but only abnormal results are displayed)  Labs Reviewed  BASIC METABOLIC PANEL - Abnormal; Notable for the following components:      Result Value   Potassium 3.2 (*)    All other components within normal limits  CBC - Abnormal; Notable for the following components:   WBC 12.2 (*)    All other components within normal limits  TSH  T4, FREE  MAGNESIUM  TROPONIN I (HIGH SENSITIVITY)  TROPONIN I (HIGH SENSITIVITY)   ____________________________________________  EKG  ED ECG REPORT I, Nita Sickle, the attending physician, personally viewed and interpreted this ECG.  Normal sinus rhythm, rate of 63, normal intervals, normal axis, no ST elevations or depressions. ____________________________________________  RADIOLOGY  I have personally reviewed the images performed during this visit and I agree with the Radiologist's read.   Interpretation by Radiologist:  DG Chest 2 View  Result Date: 06/06/2020 CLINICAL DATA:  Chest pain EXAM: CHEST - 2 VIEW COMPARISON:  10/18/2017 FINDINGS: The heart size and mediastinal contours are within normal limits. Both lungs are clear. The visualized skeletal structures are unremarkable. IMPRESSION: No active cardiopulmonary disease. Electronically Signed   By: Deatra Robinson M.D.   On: 06/06/2020 22:09    ____________________________________________   PROCEDURES  Procedure(s) performed:yes .1-3 Lead EKG Interpretation Performed by: Nita Sickle, MD Authorized by: Nita Sickle, MD      Interpretation: non-specific     ECG rate assessment: normal     Rhythm: sinus rhythm     Ectopy: PVCs     Conduction: normal     Critical Care performed:  None ____________________________________________   INITIAL IMPRESSION / ASSESSMENT AND PLAN / ED COURSE   25 y.o. male for history of asthma and pneumothorax who presents for evaluation of palpitations that have been happening nightly for the last week only when he falls asleep.  No palpitations during the day.  No other associated symptoms.  Patient is well-appearing in no distress with normal vital signs, normal work of breathing normal sats.  Looks euvolemic.  Heart regular rate and rhythm with no murmurs.  EKG with no dysrhythmias or ischemia.  Patient placed on telemetry for monitoring for any signs of dysrhythmias.  Labs showing mild hypokalemia which is supplemented p.o. no thyroid dysfunction, 2 high-sensitivity troponin negative, no signs of diabetes no significant electrolyte derangements.  Mild leukocytosis of unknown etiology.  Chest x-ray visualized by me with no signs of pneumothorax,  edema, or any other abnormalities.  After being monitored on telemetry patient found not to have any dysrhythmias in the emergency room.  Will refer to cardiology for Holter monitoring.  Discussed my standard return precautions.  Low suspicion for alternative etiologies such as PE with short-lived symptoms. Possibly anxiety.   _________________________ 6:19 AM on 06/07/2020 -----------------------------------------  Patient monitored on telemetry for a couple of hours with PVCs but no other signs of dysrhythmias.  Discussed these findings with patient and recommended follow-up with cardiology for Holter monitoring.  We discussed return precautions.    _____________________________________________ Please note:  Patient was evaluated in Emergency Department today for the symptoms described in the history of present illness. Patient was  evaluated in the context of the global COVID-19 pandemic, which necessitated consideration that the patient might be at risk for infection with the SARS-CoV-2 virus that causes COVID-19. Institutional protocols and algorithms that pertain to the evaluation of patients at risk for COVID-19 are in a state of rapid change based on information released by regulatory bodies including the CDC and federal and state organizations. These policies and algorithms were followed during the patient's care in the ED.  Some ED evaluations and interventions may be delayed as a result of limited staffing during the pandemic.   [place] Controlled Substance Database was reviewed by me. ____________________________________________   FINAL CLINICAL IMPRESSION(S) / ED DIAGNOSES   Final diagnoses:  Palpitations      NEW MEDICATIONS STARTED DURING THIS VISIT:  ED Discharge Orders    None       Note:  This document was prepared using Dragon voice recognition software and may include unintentional dictation errors.    Nita Sickle, MD 06/07/20 [phone]

## 2020-07-15 IMAGING — CR DG CHEST 2V
1 series · 2 of 2 positions shown · non-contrast
Comparison: 09/08/2015

CLINICAL DATA: Productive cough

EXAM:
CHEST - 2 VIEW

[Series 1: dg chest 2 view · 0.14mm/px · 2 of 2 slices shown]
[im 1/2]
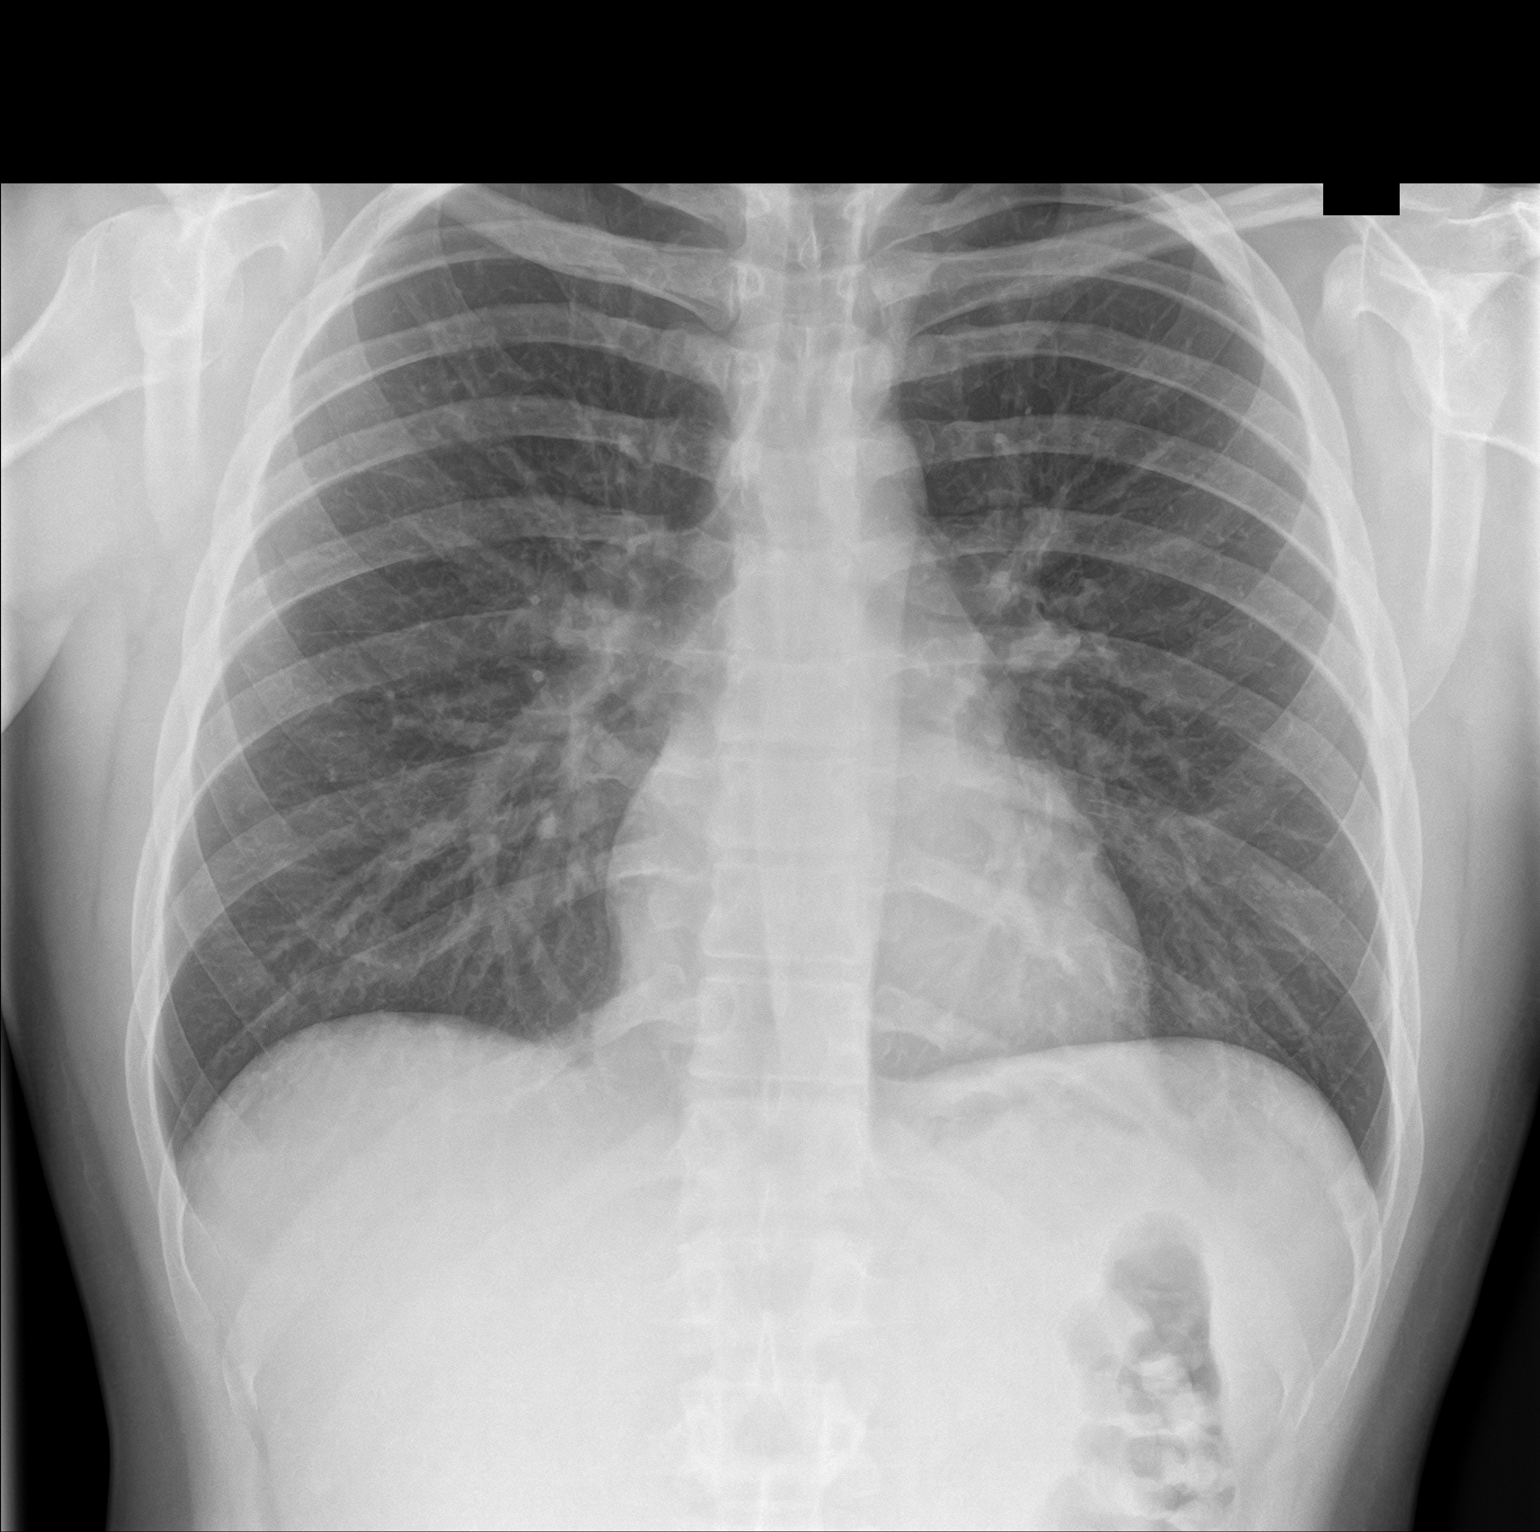
[im 2/2]
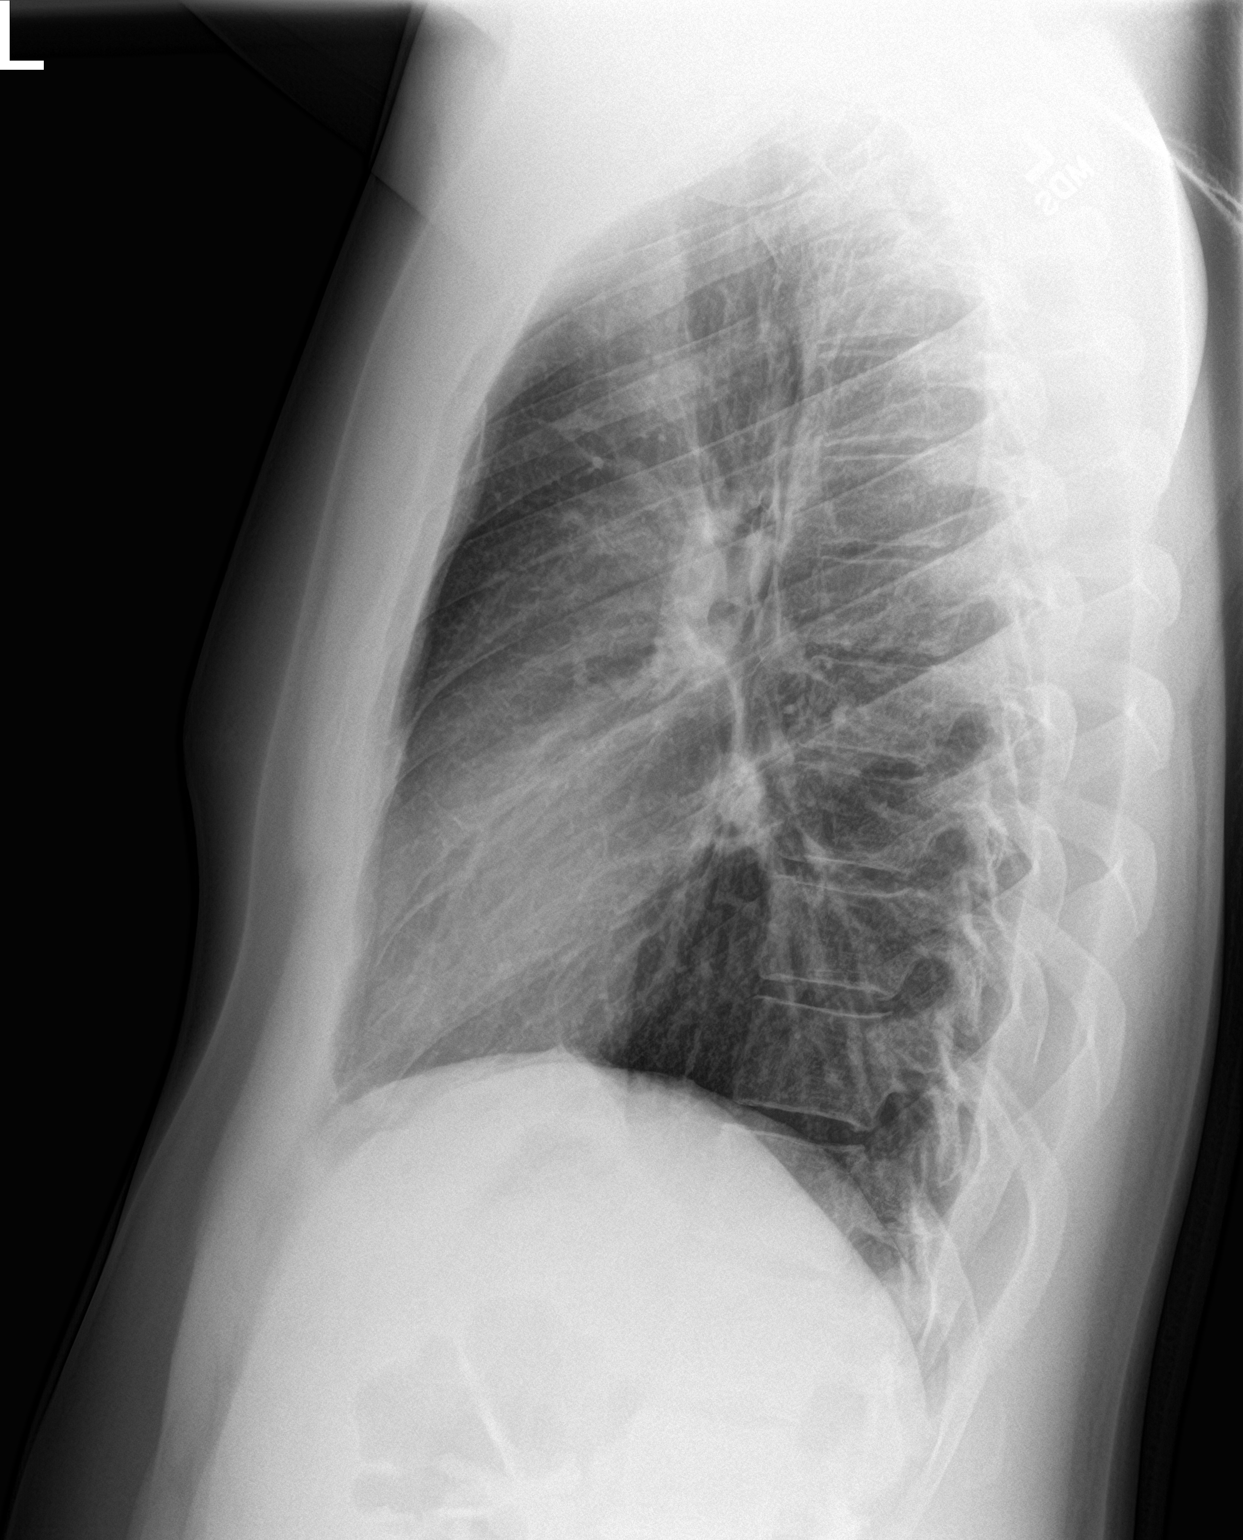

[2 of 2 positions shown; findings below may reference images not displayed]

FINDINGS: Heart and mediastinal contours are within normal limits. No focal
opacities or effusions. No acute bony abnormality.
IMPRESSION: No active cardiopulmonary disease.

## 2021-11-21 ENCOUNTER — Encounter: Payer: Self-pay | Admitting: Intensive Care

## 2021-11-21 ENCOUNTER — Other Ambulatory Visit: Payer: Self-pay

## 2021-11-21 ENCOUNTER — Emergency Department
Admission: EM | Admit: 2021-11-21 | Discharge: 2021-11-21 | Disposition: A | Discharge status: Home / Self Care | Payer: BC Managed Care – PPO | Attending: Emergency Medicine | Admitting: Emergency Medicine

## 2021-11-21 ENCOUNTER — Emergency Department: Payer: BC Managed Care – PPO

## 2021-11-21 DIAGNOSIS — U071 COVID-19: Secondary | ICD-10-CM | POA: Diagnosis not present

## 2021-11-21 DIAGNOSIS — Z87891 Personal history of nicotine dependence: Secondary | ICD-10-CM | POA: Diagnosis not present

## 2021-11-21 DIAGNOSIS — R509 Fever, unspecified: Secondary | ICD-10-CM | POA: Diagnosis present

## 2021-11-21 DIAGNOSIS — J45909 Unspecified asthma, uncomplicated: Secondary | ICD-10-CM | POA: Insufficient documentation

## 2021-11-21 LAB — CBC
HCT: 42 % (ref 39.0–52.0)
Hemoglobin: 14.3 g/dL (ref 13.0–17.0)
MCH: 29.3 pg (ref 26.0–34.0)
MCHC: 34 g/dL (ref 30.0–36.0)
MCV: 86.1 fL (ref 80.0–100.0)
Platelets: 214 10*3/uL (ref 150–400)
RBC: 4.88 MIL/uL (ref 4.22–5.81)
RDW: 12.8 % (ref 11.5–15.5)
WBC: 10 10*3/uL (ref 4.0–10.5)
nRBC: 0 % (ref 0.0–0.2)

## 2021-11-21 LAB — RESP PANEL BY RT-PCR (FLU A&B, COVID) ARPGX2
Influenza A by PCR: NEGATIVE
Influenza B by PCR: NEGATIVE
SARS Coronavirus 2 by RT PCR: POSITIVE — AB

## 2021-11-21 LAB — GROUP A STREP BY PCR: Group A Strep by PCR: NOT DETECTED

## 2021-11-21 LAB — MONONUCLEOSIS SCREEN: Mono Screen: NEGATIVE

## 2021-11-21 MED ORDER — NIRMATRELVIR/RITONAVIR (PAXLOVID)TABLET: 3.0000 | ORAL_TABLET | Freq: Two times a day (BID) | ORAL | 0 refills | Status: AC

## 2021-11-21 NOTE — Discharge Instructions (Signed)
You were seen today for URI symptoms.  You tested positive for COVID-19.  I am starting you on the oral antiviral twice daily for the next 5 days.  You may use cough syrup OTC as needed.  Use your inhaler as needed.  Please follow-up with your PCP if symptoms persist or worsen.

## 2021-11-21 NOTE — ED Triage Notes (Signed)
Patient presents with fever, sore throat, congestion, and feeling fatigued. Patient reports he has been sick for a month.   Reports he is currently taking doxycycline for "throat infection." Reports finishing amoxicillin

## 2021-11-21 NOTE — ED Provider Notes (Signed)
[facility] EMERGENCY DEPARTMENT Provider Note   CSN: [id number] Arrival date & time: 11/21/21  1410     History  Chief Complaint  Patient presents with   Sore Throat   Fever    Jose Everett is a 26 y.o. male presents to the ER today with complaint of headache, ear fullness, sore throat, cough and nausea.  He reports this started 1 month ago.  The headache is located in his temples.  He describes the pain as pressure.  He denies ear drainage or loss of hearing.  He denies difficulty swallowing.  The cough is mostly nonproductive.  He denies runny nose, nasal congestion, shortness of breath, chest pain, vomiting or diarrhea.  He reports throughout the month, he has had fever, chills and body aches.  He initially did a telehealth appointment and got Amoxicillin for presumed strep throat.  He subsequently followed up with his PCP, had a negative strep, COVID and flu test.  His Amoxicillin was changed to Azithromycin.  1 week later, he was not feeling any better so he went back to his PCP and they put him on Doxycycline which he is currently still taking.  HPI     Home Medications Prior to Admission medications   Medication Sig Start Date End Date Taking? Authorizing Provider  nirmatrelvir/ritonavir EUA (PAXLOVID) 20 x 150 MG & 10 x 100MG  TABS Take 3 tablets by mouth 2 (two) times daily for 5 days. GFR is> 60. Take nirmatrelvir (150 mg) 2 tabs PO BID x 5 days and ritonavir (100 mg) 1 tab PO BID x 5 days. 11/21/21 11/26/21 Yes Delta Pichon, 13/10/23, NP  brompheniramine-pseudoephedrine-DM 30-2-10 MG/5ML syrup Take 5 mLs by mouth 4 (four) times daily as needed. 10/18/17   12/18/17, PA-C  ibuprofen (ADVIL) 800 MG tablet Take 1 tablet (800 mg total) by mouth every 8 (eight) hours as needed. 06/14/18   06/16/18, MD  meclizine (ANTIVERT) 25 MG tablet Take 1 tablet (25 mg total) by mouth 3 (three) times daily as needed for dizziness. 09/18/17   11/18/17, MD   methylPREDNISolone (MEDROL DOSEPAK) 4 MG TBPK tablet Take Tapered dose as directed 10/18/17   12/18/17, PA-C      Allergies    Tramadol    Review of Systems   Review of Systems  Past Medical History:  Diagnosis Date   Asthma    Chronic dental pain     No current facility-administered medications for this encounter.   Current Outpatient Medications  Medication Sig Dispense Refill   nirmatrelvir/ritonavir EUA (PAXLOVID) 20 x 150 MG & 10 x 100MG  TABS Take 3 tablets by mouth 2 (two) times daily for 5 days. GFR is> 60. Take nirmatrelvir (150 mg) 2 tabs PO BID x 5 days and ritonavir (100 mg) 1 tab PO BID x 5 days. 30 tablet 0   brompheniramine-pseudoephedrine-DM 30-2-10 MG/5ML syrup Take 5 mLs by mouth 4 (four) times daily as needed. 120 mL 0   ibuprofen (ADVIL) 800 MG tablet Take 1 tablet (800 mg total) by mouth every 8 (eight) hours as needed. 30 tablet 0   meclizine (ANTIVERT) 25 MG tablet Take 1 tablet (25 mg total) by mouth 3 (three) times daily as needed for dizziness. 30 tablet 0   methylPREDNISolone (MEDROL DOSEPAK) 4 MG TBPK tablet Take Tapered dose as directed 21 tablet 0    Allergies  Allergen Reactions   Tramadol     'body got hot'  History reviewed. No pertinent family history.  Social History   Socioeconomic History   Marital status: Single    Spouse name: Not on file   Number of children: Not on file   Years of education: Not on file   Highest education level: Not on file  Occupational History   Not on file  Tobacco Use   Smoking status: Former    Types: Cigarettes   Smokeless tobacco: Never  Vaping Use   Vaping Use: Former  Substance and Sexual Activity   Alcohol use: Not Currently    Comment: occ   Drug use: No   Sexual activity: Yes  Other Topics Concern   Not on file  Social History Narrative   Not on file   Social Determinants of Health   Financial Resource Strain: Not on file  Food Insecurity: Not on file  Transportation Needs:  Not on file  Physical Activity: Not on file  Stress: Not on file  Social Connections: Not on file  Intimate Partner Violence: Not on file     Constitutional: Patient reports headache.  Denies fever, malaise, fatigue, or abrupt weight changes.  HEENT: Patient reports ear fullness and sore throat.  Denies eye pain, eye redness, ear pain, ringing in the ears, wax buildup, runny nose, nasal congestion, bloody nose. Respiratory: Patient reports cough.  Denies difficulty breathing, shortness of breath, cough.   Cardiovascular: Denies chest pain, chest tightness, palpitations or swelling in the hands or feet.  Gastrointestinal: Patient reports nausea.  Denies abdominal pain, bloating, constipation, diarrhea or blood in the stool.   No other specific complaints in a complete review of systems (except as listed in HPI above).  Physical Exam Updated Vital Signs BP 103/63 (BP Location: Left Arm)   Pulse (!) 110   Temp 99.3 F (37.4 C) (Oral)   Resp 18   Ht 5\' 11"  (1.803 m)   Wt 108.9 kg   SpO2 98%   BMI 33.47 kg/m  Physical Exam BP 103/63 (BP Location: Left Arm)   Pulse (!) 110   Temp 99.3 F (37.4 C) (Oral)   Resp 18   Ht 5\' 11"  (1.803 m)   Wt 108.9 kg   SpO2 98%   BMI 33.47 kg/m  Wt Readings from Last 3 Encounters:  11/21/21 108.9 kg  06/06/20 90.7 kg  06/14/18 90.7 kg    General: Appears his stated age, well developed, well nourished in NAD. Skin: Warm, dry and intact. No rashes noted. HEENT: Head: normal shape and size, no sinus tenderness noted; Eyes: sclera white, no icterus, conjunctiva pink, PERRLA and EOMs intact; Ears: Tm's gray and intact, normal light reflex; Nose: mucosa pink and moist, septum midline; Throat/Mouth: Teeth present, mucosa pink and moist, no exudate, lesions or ulcerations noted.  Neck: No adenopathy noted. Cardiovascular: Tachycardic with normal rhythm. S1,S2 noted.  No murmur, rubs or gallops noted.  Pulmonary/Chest: Normal effort and positive  vesicular breath sounds. No respiratory distress. No wheezes, rales or ronchi noted.  Abdomen: Soft and nontender. Normal bowel sounds.  Neurological: Alert and oriented.     ED Results / Procedures / Treatments   Labs  Labs Reviewed  RESP PANEL BY RT-PCR (FLU A&B, COVID) ARPGX2 - Abnormal; Notable for the following components:      Result Value   SARS Coronavirus 2 by RT PCR POSITIVE (*)    All other components within normal limits  GROUP A STREP BY PCR  CBC  MONONUCLEOSIS SCREEN     Radiology  Imaging Orders         DG Chest 2 View    IMPRESSION: No acute cardiopulmonary disease.  Medications Ordered in ED Medications - No data to display  ED Course/ Medical Decision Making/ A&P  Acute Headache, Ear Fullness, Sore Throat, Cough, Nausea:  DDx include influenza, COVID, RSV, viral pharyngitis, strep pharyngitis, allergic rhinitis, viral URI with cough, acute bronchitis, mononucleosis  Strep test negative COVID/flu/RSV swab positive for covid CBC does not show any evidence of leukocytosis or anemia Mononucleosis negative Chest x-ray does not show any evidence of infiltrate per my read, confirmed by radiology RX for Paxolvid x 5 days Encouraged rest and fluids  Final Clinical Impression(s) / ED Diagnoses Final diagnoses:  COVID-19    Rx / DC Orders ED Discharge Orders          Ordered    nirmatrelvir/ritonavir EUA (PAXLOVID) 20 x 150 MG & 10 x 100MG  TABS  2 times daily        11/21/21 1722              13/05/23, NP 11/21/21 1723    13/05/23, MD 11/21/21 2003

## 2022-12-16 ENCOUNTER — Other Ambulatory Visit: Payer: Self-pay

## 2022-12-16 ENCOUNTER — Emergency Department
Admission: EM | Admit: 2022-12-16 | Discharge: 2022-12-16 | Disposition: A | Discharge status: Home / Self Care | Payer: Worker's Compensation | Attending: Emergency Medicine | Admitting: Emergency Medicine

## 2022-12-16 DIAGNOSIS — S0990XA Unspecified injury of head, initial encounter: Secondary | ICD-10-CM | POA: Diagnosis present

## 2022-12-16 DIAGNOSIS — W228XXA Striking against or struck by other objects, initial encounter: Secondary | ICD-10-CM | POA: Diagnosis not present

## 2022-12-16 DIAGNOSIS — Y990 Civilian activity done for income or pay: Secondary | ICD-10-CM | POA: Insufficient documentation

## 2022-12-16 NOTE — ED Provider Notes (Signed)
[facility] Provider Note    Event Date/Time   First MD Initiated Contact with Patient 12/16/22 1344     (approximate)   History   Head Injury   HPI  Jose Everett is a 27 y.o. male who presents today for evaluation after head injury.  Patient reports that at work he was hit in the head with the lid of a propane tank.  He denies falling to the ground.  He denies LOC.  He denies a headache now.  He thought that there was bleeding but he has not had any bleeding since.  He has not had any neck pain, numbness, tingling, weakness.  No visual changes or light sensitivity or sound sensitivity.  No difficulty walking or speaking.  No nausea or vomiting.  No other injury sustained.  He denies taking anticoagulation.  Patient Active Problem List   Diagnosis Date Noted   Pneumothorax on left 08/22/2015          Physical Exam   Triage Vital Signs: ED Triage Vitals [12/16/22 1326]  Encounter Vitals Group     BP 113/74     Systolic BP Percentile      Diastolic BP Percentile      Pulse Rate 75     Resp 16     Temp 98.3 F (36.8 C)     Temp Source Oral     SpO2 97 %     Weight 240 lb (108.9 kg)     Height 5\' 11"  (1.803 m)     Head Circumference      Peak Flow      Pain Score 6     Pain Loc      Pain Education      Exclude from Growth Chart     Most recent vital signs: Vitals:   12/16/22 1326  BP: 113/74  Pulse: 75  Resp: 16  Temp: 98.3 F (36.8 C)  SpO2: 97%    Physical Exam Vitals and nursing note reviewed.  Constitutional:      General: Awake and alert. No acute distress.    Appearance: Normal appearance. The patient is normal weight.  HENT:     Head: Normocephalic and atraumatic.  No open wounds or evidence of injury to head    Mouth: Mucous membranes are moist.  Eyes:     General: PERRL. Normal EOMs        Right eye: No discharge.        Left eye: No discharge.     Conjunctiva/sclera: Conjunctivae normal.   Cardiovascular:     Rate and Rhythm: Normal rate and regular rhythm.     Pulses: Normal pulses.  Pulmonary:     Effort: Pulmonary effort is normal. No respiratory distress.     Breath sounds: Normal breath sounds.  Abdominal:     Abdomen is soft. There is no abdominal tenderness. No rebound or guarding. No distention. Musculoskeletal:        General: No swelling. Normal range of motion.     Cervical back: Normal range of motion and neck supple. No midline cervical spine tenderness.  Full range of motion of neck.  Negative Spurling test.  Negative Lhermitte sign.  Normal strength and sensation in bilateral upper extremities. Normal grip strength bilaterally.  Normal intrinsic muscle function of the hand bilaterally.  Normal radial pulses bilaterally. Skin:    General: Skin is warm and dry.     Capillary Refill: Capillary refill takes less  than 2 seconds.     Findings: No rash.  Neurological:     Mental Status: The patient is awake and alert.   Neurological: GCS 15 alert and oriented x3 Normal speech, no expressive or receptive aphasia or dysarthria Cranial nerves II through XII intact Normal visual fields 5 out of 5 strength in all 4 extremities with intact sensation throughout No extremity drift Normal finger-to-nose testing, no limb or truncal ataxia Normal gait   ED Results / Procedures / Treatments   Labs (all labs ordered are listed, but only abnormal results are displayed) Labs Reviewed - No data to display   EKG     RADIOLOGY     PROCEDURES:  Critical Care performed:   Procedures   MEDICATIONS ORDERED IN ED: Medications - No data to display   IMPRESSION / MDM / ASSESSMENT AND PLAN / ED COURSE  I reviewed the triage vital signs and the nursing notes.   Differential diagnosis includes, but is not limited to, concussion, contusion, abrasion, less likely intracranial hemorrhage or cervical spine injury.  Patient is awake and alert, hemodynamically  stable and neurologically and neurovascularly intact.  Patient presents to the emergency department after fall with head strike. No neurological deficits or midline spinal tenderness. Not encephalopathic, overall well-appearing. Physical examination and imaging reassuring against urgent or emergent traumatic process.  Normal strength and sensation of bilateral upper extremities, normal grip strength bilaterally, no signs or symptoms of central cord syndrome.  Does not require any other imaging or intervention at this time per [place] criteria.  Patient is in agreement with this.  We also discussed concussion precautions.  He declined work note.  Discussed care plan, return precautions, and advised close outpatient follow-up. Patient agrees with plan of care.   Patient's presentation is most consistent with acute illness / injury with system symptoms.   FINAL CLINICAL IMPRESSION(S) / ED DIAGNOSES   Final diagnoses:  Injury of head, initial encounter     Rx / DC Orders   ED Discharge Orders     None        Note:  This document was prepared using Dragon voice recognition software and may include unintentional dictation errors.   Keturah Shavers 12/16/22 1813    Pilar Jarvis, MD 12/16/22 [phone]

## 2022-12-16 NOTE — ED Triage Notes (Signed)
Pt states that he was hit in the head with the lid of a propane tank that is approximately the size of a car hood. Pt denies loc but states he saw a flash of light, states that this happened at work and states that he works for [employer] and it is worker's comp

## 2022-12-16 NOTE — Discharge Instructions (Addendum)
Please return if you develop vomiting, visual changes, trouble walking or speaking, numbness, tingling, weakness, or any other concerns.  It was a pleasure caring for you today.

## 2023-03-04 IMAGING — CR DG CHEST 2V
2 series · 2 of 2 positions shown · non-contrast
Comparison: 10/18/2017

CLINICAL DATA: Chest pain

EXAM:
CHEST - 2 VIEW

[chest pa]
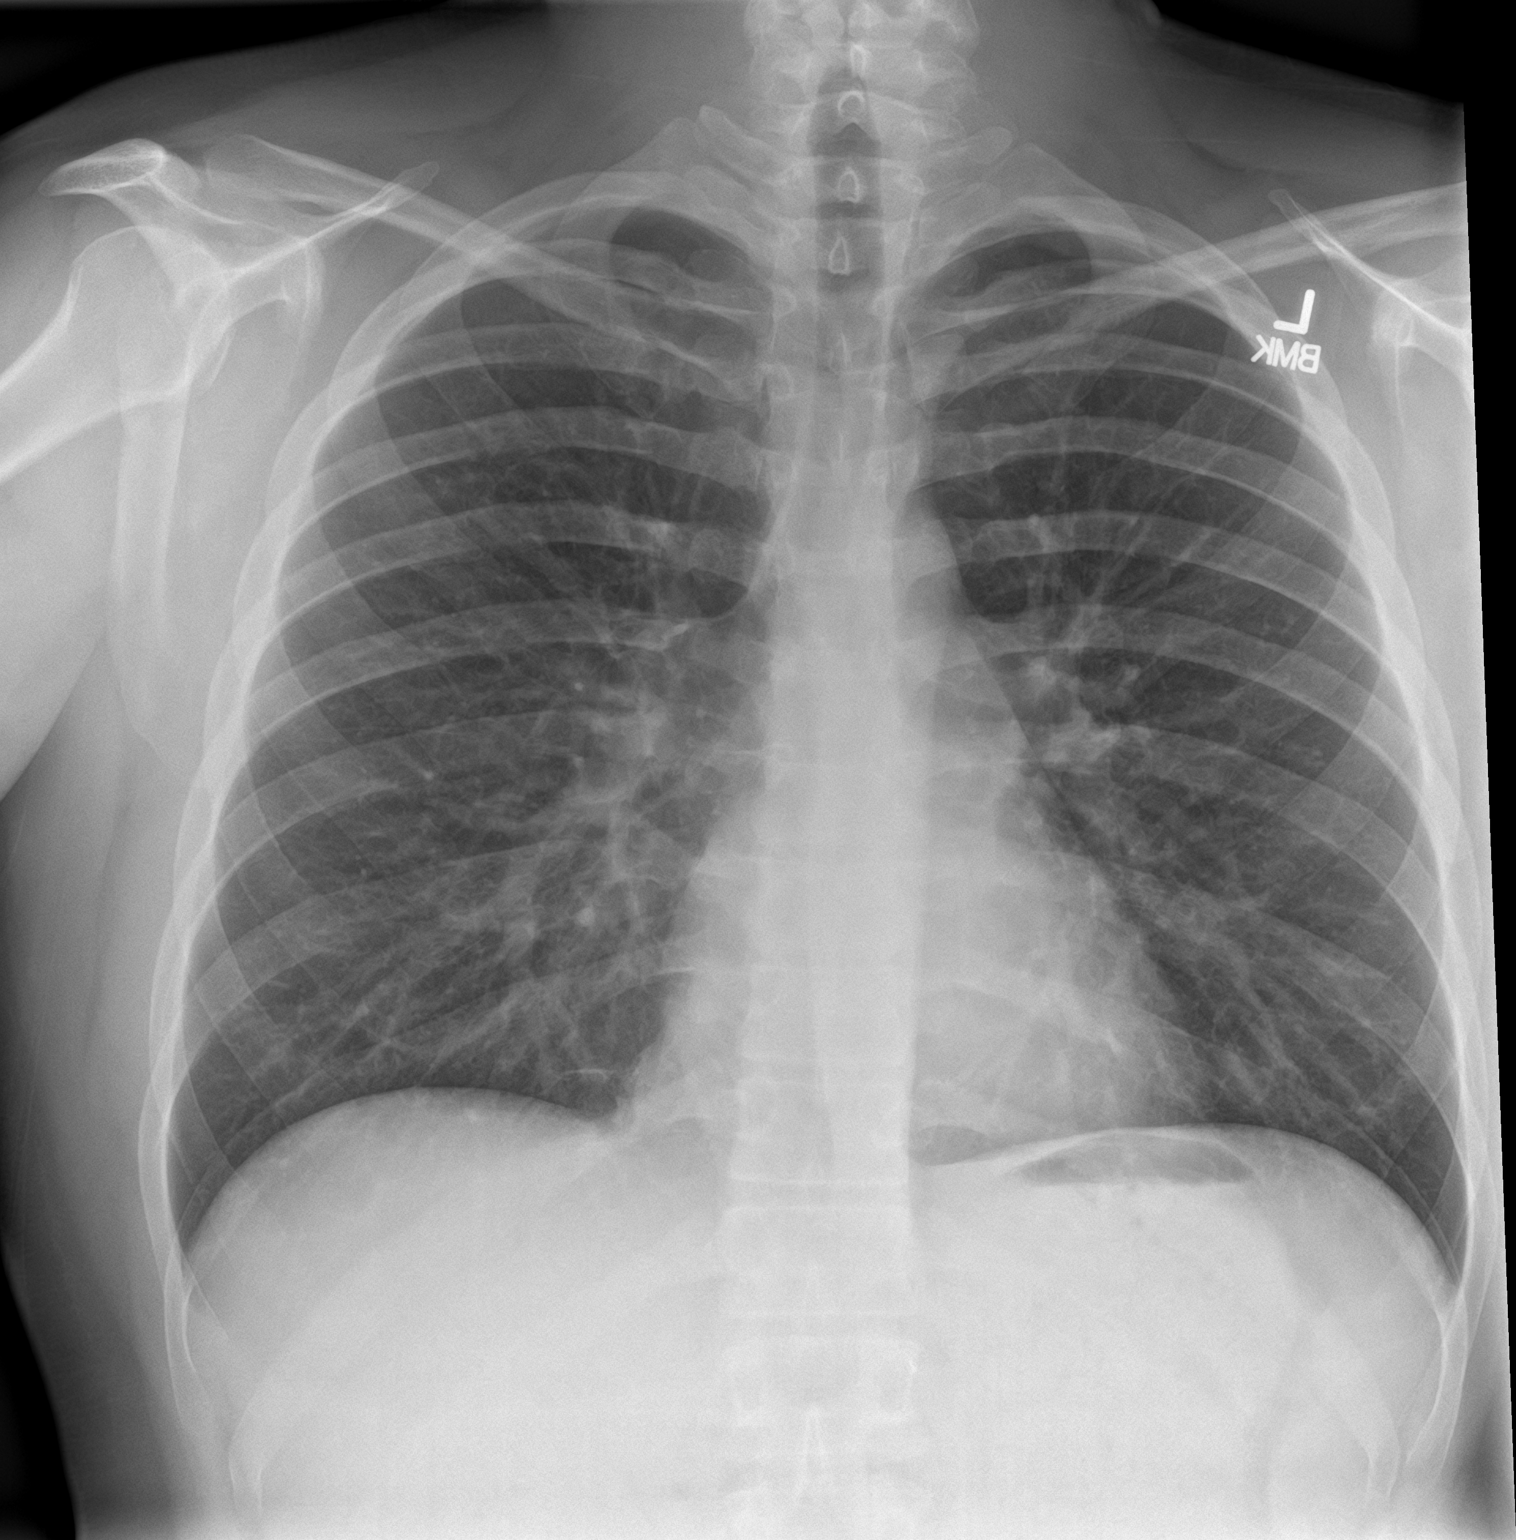

[chest lat]
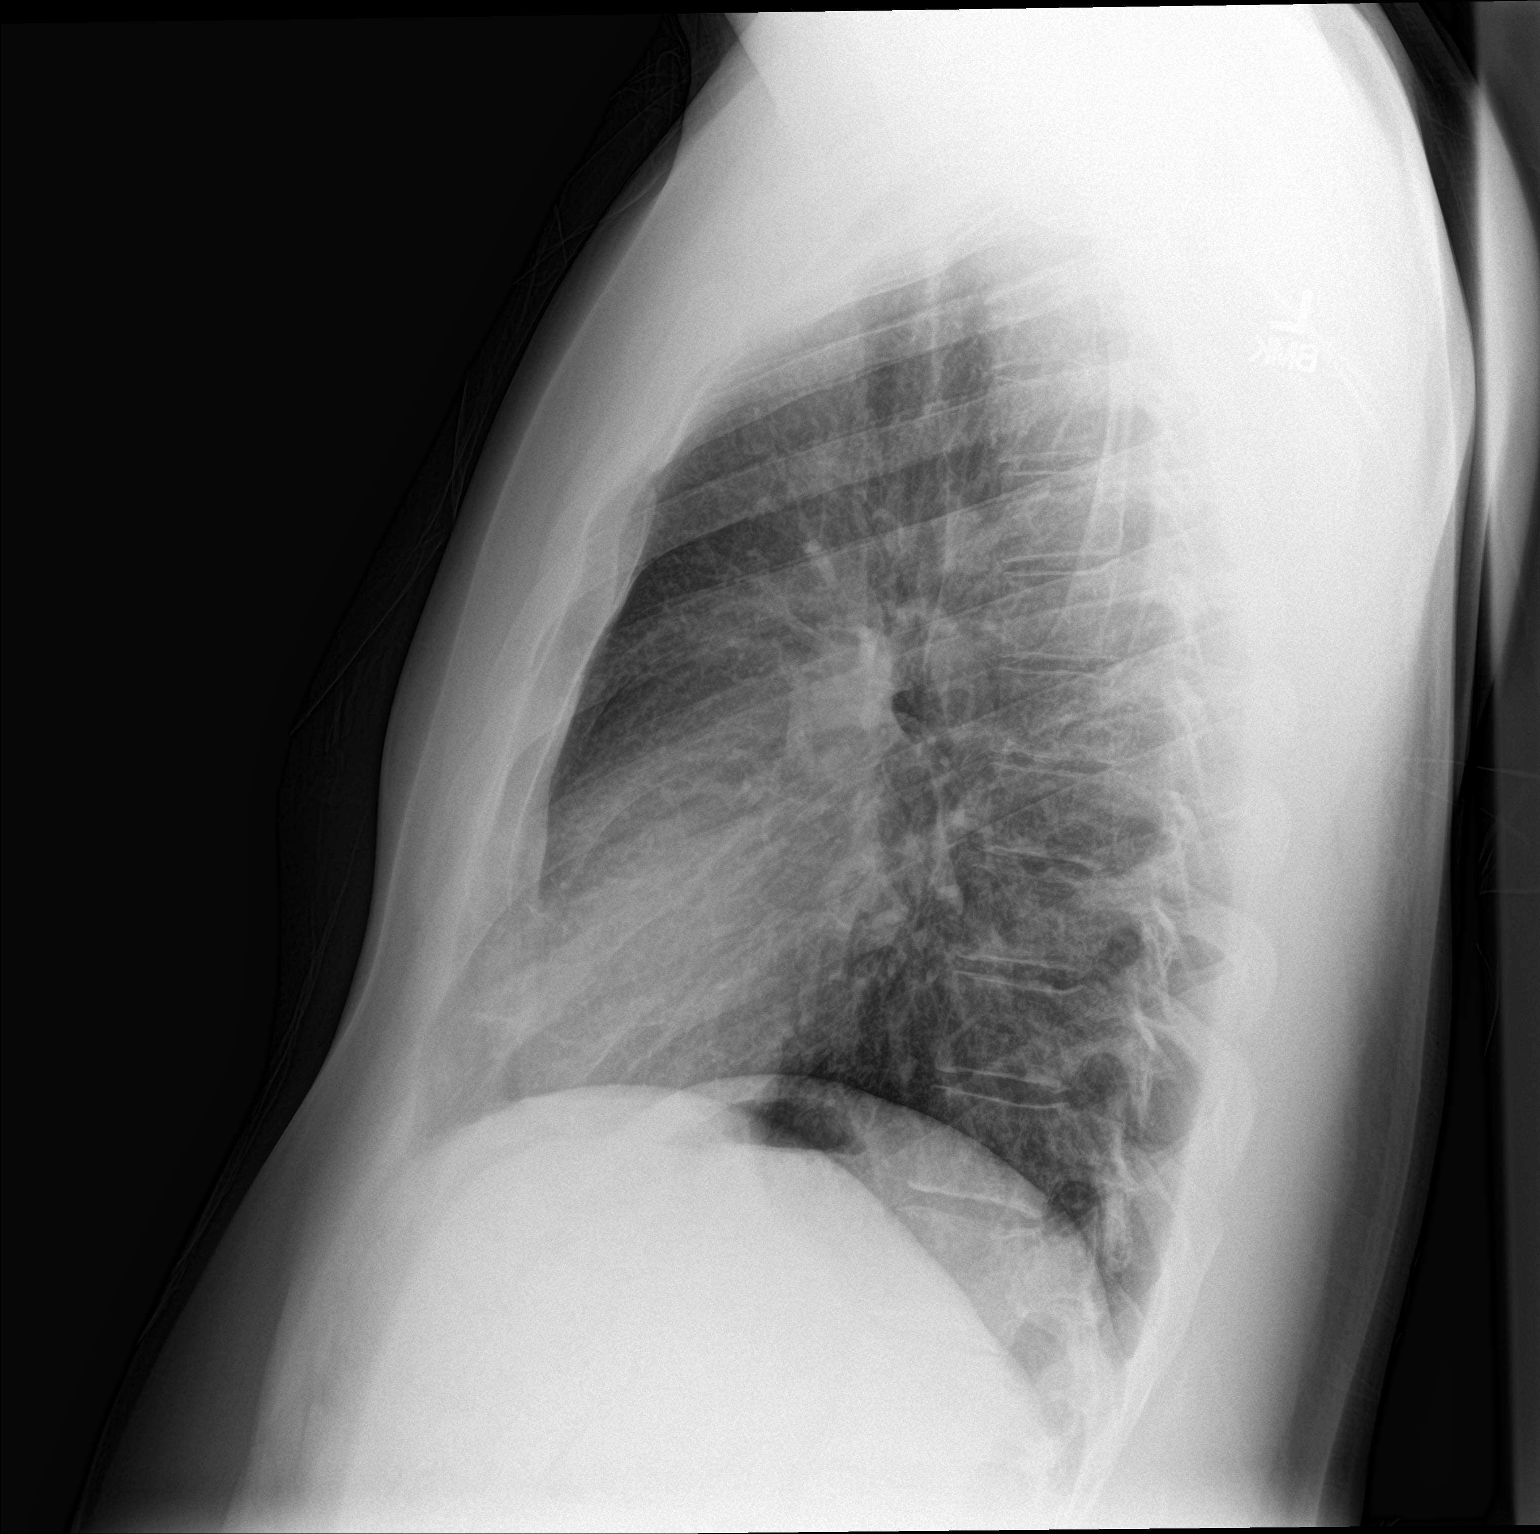

[2 of 2 positions shown; findings below may reference images not displayed]

FINDINGS: The heart size and mediastinal contours are within normal limits.
Both lungs are clear. The visualized skeletal structures are
unremarkable.
IMPRESSION: No active cardiopulmonary disease.

## 2023-06-16 ENCOUNTER — Emergency Department
Admission: EM | Admit: 2023-06-16 | Discharge: 2023-06-16 | Disposition: A | Discharge status: Home / Self Care | Attending: Emergency Medicine | Admitting: Emergency Medicine

## 2023-06-16 ENCOUNTER — Other Ambulatory Visit: Payer: Self-pay

## 2023-06-16 ENCOUNTER — Encounter: Payer: Self-pay | Admitting: *Deleted

## 2023-06-16 DIAGNOSIS — T189XXA Foreign body of alimentary tract, part unspecified, initial encounter: Secondary | ICD-10-CM | POA: Diagnosis present

## 2023-06-16 DIAGNOSIS — X58XXXA Exposure to other specified factors, initial encounter: Secondary | ICD-10-CM | POA: Insufficient documentation

## 2023-06-16 DIAGNOSIS — T18108A Unspecified foreign body in esophagus causing other injury, initial encounter: Secondary | ICD-10-CM | POA: Insufficient documentation

## 2023-06-16 NOTE — ED Notes (Signed)
 No xrays at this time per dr Cam Cava.  Pt given warm coke to drink while waiting to see md per dr Cam Cava.

## 2023-06-16 NOTE — ED Notes (Signed)
 Pt drank coke without any relief.  No vomiting.  Pt was able to keep coke down.  States pork chop is still hung in throat.  No resp distress.

## 2023-06-16 NOTE — ED Triage Notes (Signed)
 Pt ambulatory to triage.  Pt reports he ate a pork chop and feels like it is hung in his throat.  Sx for 15 minutes     No resp distress.  Pt talking in complete sentences.  Pt alert  speech clear.

## 2023-06-16 NOTE — ED Provider Notes (Signed)
   [facility] Provider Note    Event Date/Time   First MD Initiated Contact with Patient 06/16/23 2055     (approximate)   History   Swallowed Foreign Body   HPI  Jose Everett is a 28 y.o. male with no significant past medical history who presents with complaints of sensation of something stuck in his throat.  Patient reports he was eating pork chops and thought it may have gotten stuck in his throat, he thinks it is improved now but still concerned there may be food in his esophagus.     Physical Exam   Triage Vital Signs: ED Triage Vitals  Encounter Vitals Group     BP 06/16/23 1954 130/88     Systolic BP Percentile --      Diastolic BP Percentile --      Pulse Rate 06/16/23 1954 100     Resp 06/16/23 1954 18     Temp 06/16/23 1954 98.1 F (36.7 C)     Temp Source 06/16/23 1954 Oral     SpO2 06/16/23 1954 100 %     Weight 06/16/23 1955 113.4 kg (250 lb)     Height 06/16/23 1955 1.803 m (5\' 11" )     Head Circumference --      Peak Flow --      Pain Score 06/16/23 1955 3     Pain Loc --      Pain Education --      Exclude from Growth Chart --     Most recent vital signs: Vitals:   06/16/23 1954  BP: 130/88  Pulse: 100  Resp: 18  Temp: 98.1 F (36.7 C)  SpO2: 100%     General: Awake, no distress.  CV:  Good peripheral perfusion.  Resp:  Normal effort.  Abd:  No distention.  Other:     ED Results / Procedures / Treatments   Labs (all labs ordered are listed, but only abnormal results are displayed) Labs Reviewed - No data to display   EKG     RADIOLOGY     PROCEDURES:  Critical Care performed:   Procedures   MEDICATIONS ORDERED IN ED: Medications - No data to display   IMPRESSION / MDM / ASSESSMENT AND PLAN / ED COURSE  I reviewed the triage vital signs and the nursing notes. Patient's presentation is most consistent with acute presentation with potential threat to life or bodily  function.  Patient presents with possible esophageal food impaction.  I had him drink soda here which she tolerated without difficulty.  He reports he feels like it is moving through well.  Given this unlikely to be significant food impaction.  He is tolerating p.o.'s now.  He does report a history of GERD for which she has taken PPIs, also reports a history of intermittent difficulty with swallowing, will refer him to GI for further evaluation, he agrees with this plan, return precautions discussed        FINAL CLINICAL IMPRESSION(S) / ED DIAGNOSES   Final diagnoses:  Esophageal foreign body, initial encounter     Rx / DC Orders   ED Discharge Orders     None        Note:  This document was prepared using Dragon voice recognition software and may include unintentional dictation errors.   Bryson Carbine, MD 06/16/23 2212

## 2024-04-05 ENCOUNTER — Encounter
